# Patient Record
Sex: Female | Born: 1970 | Hispanic: Yes | Marital: Single | Smoking: Never smoker
Health system: Southern US, Community
[De-identification: ages and names within clinical notes are randomized; demographics above are authoritative.]

## PROBLEM LIST (undated history)

## (undated) DIAGNOSIS — I959 Hypotension, unspecified: Secondary | ICD-10-CM

## (undated) DIAGNOSIS — G44009 Cluster headache syndrome, unspecified, not intractable: Secondary | ICD-10-CM

## (undated) DIAGNOSIS — R7303 Prediabetes: Secondary | ICD-10-CM

## (undated) DIAGNOSIS — Z9989 Dependence on other enabling machines and devices: Secondary | ICD-10-CM

## (undated) DIAGNOSIS — D649 Anemia, unspecified: Secondary | ICD-10-CM

## (undated) DIAGNOSIS — M1990 Unspecified osteoarthritis, unspecified site: Secondary | ICD-10-CM

## (undated) DIAGNOSIS — J385 Laryngeal spasm: Secondary | ICD-10-CM

## (undated) DIAGNOSIS — T8859XA Other complications of anesthesia, initial encounter: Secondary | ICD-10-CM

## (undated) DIAGNOSIS — F329 Major depressive disorder, single episode, unspecified: Secondary | ICD-10-CM

## (undated) DIAGNOSIS — F419 Anxiety disorder, unspecified: Secondary | ICD-10-CM

## (undated) DIAGNOSIS — J45909 Unspecified asthma, uncomplicated: Secondary | ICD-10-CM

## (undated) DIAGNOSIS — I639 Cerebral infarction, unspecified: Secondary | ICD-10-CM

## (undated) DIAGNOSIS — G43909 Migraine, unspecified, not intractable, without status migrainosus: Secondary | ICD-10-CM

## (undated) DIAGNOSIS — K219 Gastro-esophageal reflux disease without esophagitis: Secondary | ICD-10-CM

## (undated) DIAGNOSIS — F32A Depression, unspecified: Secondary | ICD-10-CM

## (undated) DIAGNOSIS — T4145XA Adverse effect of unspecified anesthetic, initial encounter: Secondary | ICD-10-CM

## (undated) DIAGNOSIS — J189 Pneumonia, unspecified organism: Secondary | ICD-10-CM

## (undated) HISTORY — PX: MENISCUS REPAIR: SHX5179

## (undated) HISTORY — PX: KNEE ARTHROSCOPY: SUR90

## (undated) HISTORY — PX: BREAST REDUCTION SURGERY: SHX8

## (undated) HISTORY — PX: HYSTEROSCOPY: SHX211

---

## 2016-09-25 ENCOUNTER — Emergency Department (HOSPITAL_COMMUNITY): Payer: Medicaid - Out of State

## 2016-09-25 ENCOUNTER — Emergency Department (HOSPITAL_COMMUNITY)
Admission: EM | Admit: 2016-09-25 | Discharge: 2016-09-25 | Disposition: A | Discharge status: Home / Self Care | Payer: Medicaid - Out of State | Attending: Emergency Medicine | Admitting: Emergency Medicine

## 2016-09-25 ENCOUNTER — Encounter (HOSPITAL_COMMUNITY): Payer: Self-pay | Admitting: *Deleted

## 2016-09-25 DIAGNOSIS — Y93E8 Activity, other personal hygiene: Secondary | ICD-10-CM | POA: Insufficient documentation

## 2016-09-25 DIAGNOSIS — S83281A Other tear of lateral meniscus, current injury, right knee, initial encounter: Secondary | ICD-10-CM | POA: Insufficient documentation

## 2016-09-25 DIAGNOSIS — Y999 Unspecified external cause status: Secondary | ICD-10-CM | POA: Insufficient documentation

## 2016-09-25 DIAGNOSIS — Z79899 Other long term (current) drug therapy: Secondary | ICD-10-CM | POA: Insufficient documentation

## 2016-09-25 DIAGNOSIS — Y92009 Unspecified place in unspecified non-institutional (private) residence as the place of occurrence of the external cause: Secondary | ICD-10-CM | POA: Diagnosis not present

## 2016-09-25 DIAGNOSIS — S83421A Sprain of lateral collateral ligament of right knee, initial encounter: Secondary | ICD-10-CM | POA: Diagnosis not present

## 2016-09-25 DIAGNOSIS — S80911A Unspecified superficial injury of right knee, initial encounter: Secondary | ICD-10-CM | POA: Diagnosis present

## 2016-09-25 DIAGNOSIS — X509XXA Other and unspecified overexertion or strenuous movements or postures, initial encounter: Secondary | ICD-10-CM | POA: Diagnosis not present

## 2016-09-25 HISTORY — DX: Migraine, unspecified, not intractable, without status migrainosus: G43.909

## 2016-09-25 MED ORDER — ONDANSETRON 4 MG PO TBDP
ORAL_TABLET | ORAL | Status: AC
  Filled 2016-09-25: qty 1

## 2016-09-25 MED ORDER — HYDROCODONE-ACETAMINOPHEN 5-325 MG PO TABS: 1.0000 | ORAL_TABLET | ORAL | 0 refills | Status: DC | PRN

## 2016-09-25 MED ORDER — OXYCODONE-ACETAMINOPHEN 5-325 MG PO TABS
ORAL_TABLET | ORAL | Status: AC
  Filled 2016-09-25: qty 1

## 2016-09-25 MED ORDER — ONDANSETRON HCL 4 MG PO TABS: 4.0000 mg | ORAL_TABLET | Freq: Once | ORAL | Status: DC

## 2016-09-25 MED ORDER — OXYCODONE-ACETAMINOPHEN 5-325 MG PO TABS
1.0000 | ORAL_TABLET | ORAL | Status: DC | PRN
  Administered 2016-09-25: 1 via ORAL

## 2016-09-25 MED ORDER — METOCLOPRAMIDE HCL 10 MG PO TABS
10.0000 mg | ORAL_TABLET | Freq: Once | ORAL | Status: AC
  Administered 2016-09-25: 10 mg via ORAL
  Filled 2016-09-25: qty 1

## 2016-09-25 MED ORDER — TRAMADOL HCL 50 MG PO TABS: 50.0000 mg | ORAL_TABLET | Freq: Four times a day (QID) | ORAL | 0 refills | Status: DC | PRN

## 2016-09-25 MED ORDER — ONDANSETRON 4 MG PO TBDP
4.0000 mg | ORAL_TABLET | Freq: Once | ORAL | Status: AC
  Administered 2016-09-25: 4 mg via ORAL

## 2016-09-25 MED ORDER — METOCLOPRAMIDE HCL 10 MG PO TABS: 10.0000 mg | ORAL_TABLET | Freq: Four times a day (QID) | ORAL | 0 refills | Status: DC | PRN

## 2016-09-25 MED ORDER — NAPROXEN 375 MG PO TABS: 375.0000 mg | ORAL_TABLET | Freq: Two times a day (BID) | ORAL | 0 refills | Status: AC | PRN

## 2016-09-25 NOTE — ED Notes (Signed)
Patient given discharge instructions and verbalized understanding.  Patient stable to discharge at this time.  Patient is alert and oriented to baseline.  No distressed noted at this time.  All belongings taken with the patient at discharge.   

## 2016-09-25 NOTE — ED Notes (Signed)
Dr Isaacs at bedside 

## 2016-09-25 NOTE — ED Provider Notes (Signed)
[facility]ENCY DEPT Provider Note   CSN: [id number] Arrival date & time: 09/25/16  1645     History   Chief Complaint Chief Complaint  Patient presents with  . Knee Pain    HPI Rebecca Dougherty is a 46 y.o. female.  HPI 11 old female with history of multiple previous right knee injuries who presents with right-sided knee pain. The patient states that 3 weeks ago, she was trying to put a sock on when she felt a popping, tearing sensation in her right knee. She experienced acute onset of burning, throbbing pain of the knee. Since then, she has had progressively worsening pain with recurrent swelling with any weightbearing. She continues to feel clicking and popping with any movement of her knee. She has a history of meniscal injury and states this feels same. She is currently in process of moving from [place] and does not have an orthopedist here. Denies any fevers or chills. No open wounds.   Past Medical History:  Diagnosis Date  . Migraines     There are no active problems to display for this patient.   Past Surgical History:  Procedure Laterality Date  . MENISCUS REPAIR      OB History    No data available       Home Medications    Prior to Admission medications   Medication Sig Start Date End Date Taking? Authorizing Provider  buPROPion (WELLBUTRIN XL) 150 MG 24 hr tablet Take 150 mg by mouth 2 (two) times daily. 08/12/16  Yes [provider]  cetirizine (ZYRTEC) 10 MG tablet Take 10 mg by mouth at bedtime.   Yes [provider]  fexofenadine (ALLEGRA) 180 MG tablet Take 180 mg by mouth daily.   Yes [provider]  fluticasone (FLONASE) 50 MCG/ACT nasal spray Place 1 spray into both nostrils daily.   Yes [provider]  Glucosamine HCl (GLUCOSAMINE PO) Take 1 tablet by mouth daily.   Yes [provider]  meloxicam (MOBIC) 15 MG tablet Take 15 mg by mouth daily. 06/27/16  Yes [provider]  Multiple  Vitamins-Minerals (HAIR SKIN AND NAILS FORMULA PO) Take 1 tablet by mouth daily.   Yes [provider]  Multiple Vitamins-Minerals (MULTIVITAMIN WITH MINERALS) tablet Take 1 tablet by mouth daily.   Yes [provider]  TROKENDI XR 200 MG CP24 Take 200 mg by mouth daily. 08/12/16  Yes [provider]  HYDROcodone-acetaminophen (NORCO/VICODIN) 5-325 MG tablet Take 1-2 tablets by mouth every 4 (four) hours as needed for severe pain. 09/25/16   Shaune Pollack, MD  metoCLOPramide (REGLAN) 10 MG tablet Take 1 tablet (10 mg total) by mouth every 6 (six) hours as needed for nausea or vomiting. 09/25/16   Shaune Pollack, MD  naproxen (NAPROSYN) 375 MG tablet Take 1 tablet (375 mg total) by mouth 2 (two) times daily as needed for moderate pain. 09/25/16 10/02/16  Shaune Pollack, MD  traMADol (ULTRAM) 50 MG tablet Take 1-2 tablets (50-100 mg total) by mouth every 6 (six) hours as needed for severe pain. 09/25/16   Shaune Pollack, MD    Family History No family history on file.  Social History Social History  Substance Use Topics  . Smoking status: Never Smoker  . Smokeless tobacco: Never Used  . Alcohol use No     Allergies   Patient has no known allergies.   Review of Systems Review of Systems  Constitutional: Negative for chills and fever.  HENT: Negative for congestion, rhinorrhea  and sore throat.   Eyes: Negative for visual disturbance.  Respiratory: Negative for cough, shortness of breath and wheezing.   Cardiovascular: Negative for chest pain and leg swelling.  Gastrointestinal: Negative for abdominal pain, diarrhea, nausea and vomiting.  Genitourinary: Negative for dysuria, flank pain, vaginal bleeding and vaginal discharge.  Musculoskeletal: Positive for arthralgias and gait problem. Negative for neck pain.  Skin: Negative for rash.  Allergic/Immunologic: Negative for immunocompromised state.  Neurological: Positive for weakness. Negative for syncope and  headaches.  Hematological: Does not bruise/bleed easily.  All other systems reviewed and are negative.    Physical Exam Updated Vital Signs BP 101/72   Pulse 63   Temp 98.2 F (36.8 C) (Oral)   Resp 18   Ht 5\' 7"  (1.702 m)   Wt 81.6 kg (180 lb)   LMP 08/25/2016   SpO2 100%   BMI 28.19 kg/m   Physical Exam  Constitutional: She is oriented to person, place, and time. She appears well-developed and well-nourished. No distress.  HENT:  Head: Normocephalic and atraumatic.  Eyes: Conjunctivae are normal.  Neck: Neck supple.  Cardiovascular: Normal rate, regular rhythm and normal heart sounds.   Pulmonary/Chest: Effort normal. No respiratory distress. She has no wheezes.  Abdominal: She exhibits no distension.  Musculoskeletal: She exhibits no edema.  Neurological: She is alert and oriented to person, place, and time. She exhibits normal muscle tone.  Skin: Skin is warm. Capillary refill takes less than 2 seconds. No rash noted.  Nursing note and vitals reviewed.   LOWER EXTREMITY EXAM: RIGHT  INSPECTION & PALPATION: Marked TTP over posterior and lateral joint line, with palpable clicking on McMurray testing. No warmth, erythema, or significant effusion.  SENSORY: sensation is intact to light touch in:  Superficial peroneal nerve distribution (over dorsum of foot) Deep peroneal nerve distribution (over first dorsal web space) Sural nerve distribution (over lateral aspect 5th metatarsal) Saphenous nerve distribution (over medial instep)  MOTOR:  + Motor EHL (great toe dorsiflexion) + FHL (great toe plantar flexion)  + TA (ankle dorsiflexion)  + GSC (ankle plantar flexion)  VASCULAR: 2+ dorsalis pedis and posterior tibialis pulses Capillary refill < 2 sec, toes warm and well-perfused  COMPARTMENTS: Soft, warm, well-perfused No pain with passive extension No parethesias   ED Treatments / Results  Labs (all labs ordered are listed, but only abnormal results are  displayed) Labs Reviewed - No data to display  EKG  EKG Interpretation None       Radiology Dg Knee Complete 4 Views Right  Result Date: 09/25/2016 CLINICAL DATA:  Pain on lateral side of right knee. EXAM: RIGHT KNEE - COMPLETE 4+ VIEW COMPARISON:  None. FINDINGS: No evidence of fracture, dislocation, or joint effusion. There is decreased medial femoral tibial joint space and osteophyte formation. IMPRESSION: No acute fracture or dislocation. Degenerative joint changes of the right knee. Electronically Signed   By: Sherian ReinWei-Chen  Lin M.D.   On: 09/25/2016 20:05    Procedures Procedures (including critical care time)  Medications Ordered in ED Medications  oxyCODONE-acetaminophen (PERCOCET/ROXICET) 5-325 MG per tablet 1 tablet (1 tablet Oral Given 09/25/16 1718)  oxyCODONE-acetaminophen (PERCOCET/ROXICET) 5-325 MG per tablet (not administered)  ondansetron (ZOFRAN-ODT) 4 MG disintegrating tablet (not administered)  ondansetron (ZOFRAN-ODT) disintegrating tablet 4 mg (4 mg Oral Given 09/25/16 1718)  metoCLOPramide (REGLAN) tablet 10 mg (10 mg Oral Given 09/25/16 1933)     Initial Impression / Assessment and Plan / ED Course  I have reviewed the triage vital signs  and the nursing notes.  Pertinent labs & imaging results that were available during my care of the patient were reviewed by me and considered in my medical decision making (see chart for details).     46 yo F here with R knee pain, likely 2/2 recurrent meniscal injury. She has no significant joint effusion, warmth, or evidence to suggest septic arthritis. She is neurovascularly intact distal to the knee. Plain films show degenerative changes but no acute antibody. Will place the patient in a knee sleeve, given crutches, advised supportive care, and refer to orthopedics for further evaluation.  This note was prepared with assistance of Conservation officer, historic buildings. Occasional wrong-word or sound-a-like substitutions may have  occurred due to the inherent limitations of voice recognition software.   Final Clinical Impressions(s) / ED Diagnoses   Final diagnoses:  Sprain of lateral collateral ligament of right knee, initial encounter  Other tear of lateral meniscus of right knee as current injury, initial encounter    New Prescriptions Discharge Medication List as of 09/25/2016  8:33 PM    START taking these medications   Details  HYDROcodone-acetaminophen (NORCO/VICODIN) 5-325 MG tablet Take 1-2 tablets by mouth every 4 (four) hours as needed for severe pain., Starting Thu 09/25/2016, Print    metoCLOPramide (REGLAN) 10 MG tablet Take 1 tablet (10 mg total) by mouth every 6 (six) hours as needed for nausea or vomiting., Starting Thu 09/25/2016, Print    naproxen (NAPROSYN) 375 MG tablet Take 1 tablet (375 mg total) by mouth 2 (two) times daily as needed for moderate pain., Starting Thu 09/25/2016, Until Thu 10/02/2016, Print         Shaune Pollack, MD 09/25/16 2253

## 2016-09-25 NOTE — ED Triage Notes (Signed)
Pt states that she has torn her meniscus. Pt states that she has hx of same and is aware that she needs a partial knee repair. Pt reports trying to rest with no relief. Pt is from [place] but is moving here and will need an ortho referral

## 2016-09-25 NOTE — ED Notes (Signed)
Ortho paged for crutches 

## 2016-09-25 NOTE — Progress Notes (Signed)
Orthopedic Tech Progress Note Patient Details:  Rebecca Dougherty January 30, 1970 [id number]  Ortho Devices Type of Ortho Device: Crutches Ortho Device/Splint Location: Fitted and trained pt for crutch use.  pt stated she previously used crutches but was in too much pain to demostrate ambulation.   Ortho Device/Splint Interventions: Application, Adjustment   Alvina ChouWilliams, Arma Reining C 09/25/2016, 8:50 PM

## 2016-09-25 NOTE — ED Notes (Signed)
Pt transported to xray 

## 2016-09-29 ENCOUNTER — Ambulatory Visit (INDEPENDENT_AMBULATORY_CARE_PROVIDER_SITE_OTHER): Payer: PRIVATE HEALTH INSURANCE | Admitting: Family

## 2016-09-29 ENCOUNTER — Encounter (INDEPENDENT_AMBULATORY_CARE_PROVIDER_SITE_OTHER): Payer: Self-pay | Admitting: Family

## 2016-09-29 DIAGNOSIS — M25561 Pain in right knee: Secondary | ICD-10-CM | POA: Diagnosis not present

## 2016-09-29 DIAGNOSIS — M25361 Other instability, right knee: Secondary | ICD-10-CM

## 2016-09-29 DIAGNOSIS — Z87828 Personal history of other (healed) physical injury and trauma: Secondary | ICD-10-CM | POA: Diagnosis not present

## 2016-09-29 DIAGNOSIS — S8991XA Unspecified injury of right lower leg, initial encounter: Secondary | ICD-10-CM

## 2016-09-29 MED ORDER — TRAMADOL HCL 50 MG PO TABS: 50.0000 mg | ORAL_TABLET | Freq: Four times a day (QID) | ORAL | 0 refills | Status: DC | PRN

## 2016-09-29 MED ORDER — METHYLPREDNISOLONE ACETATE 40 MG/ML IJ SUSP
40.0000 mg | INTRAMUSCULAR | Status: AC | PRN
  Administered 2016-09-29: 40 mg via INTRA_ARTICULAR

## 2016-09-29 MED ORDER — LIDOCAINE HCL 1 % IJ SOLN
5.0000 mL | INTRAMUSCULAR | Status: AC | PRN
  Administered 2016-09-29: 5 mL

## 2016-09-29 NOTE — Progress Notes (Signed)
Office Visit Note   Patient: Rebecca Dougherty           Date of Birth: 12/08/1970           MRN: [id number] Visit Date: 09/29/2016              Requested by: No referring provider defined for this encounter. PCP: System, Pcp Not In  Chief Complaint  Patient presents with  . Right Knee - Pain      HPI: The patient is a 46 year old woman who presents today complaining of continued right knee pain. Had a right knee injury 3 weeks ago, was crossing her legs and felt a pop and heard a pop. Immediate onset of pain. She has been having difficulty with ambulation due to pain. She has instability of her knee giving way and popping and catching.  Has history of knee surgery on right knee x 2 and 2 meniscal tears on right. Feels she has torn her meniscus again.  Has a hinged knee brace from the ER. Feels it is too tight and not providing enough support. Does like wearing a neoprene sleeve. Using a cane today.   Assessment & Plan: Visit Diagnoses:  1. Knee injury, right, initial encounter   2. Knee instability, right   3. History of meniscal tear     Plan: depomedrol injection today. Offered new hinged brace, patient declined. Continue with ice.  follow up in office for MRI review.   Follow-Up Instructions: Return for mri review c duda.   Right Knee Exam   Tenderness  The patient is experiencing tenderness in the lateral joint line.  Range of Motion  The patient has normal right knee ROM. Right knee extension: pain with end extension.   Muscle Strength   The patient has normal right knee strength.  Tests  Drawer:       Anterior - positive    Posterior - negative  Valgus: negative  Other  Erythema: absent Swelling: moderate Other tests: no effusion present  Comments:  Mild varus laxity      Patient is alert, oriented, no adenopathy, well-dressed, normal affect, normal respiratory effort.   Imaging: No results found. No images are attached to the  encounter.  Labs: No results found for: HGBA1C, ESRSEDRATE, CRP, LABURIC, REPTSTATUS, GRAMSTAIN, CULT, LABORGA  Orders:  Orders Placed This Encounter  Procedures  . Large Joint Injection/Arthrocentesis  . MR Knee Right w/o contrast   Meds ordered this encounter  Medications  . traMADol (ULTRAM) 50 MG tablet    Sig: Take 1 tablet (50 mg total) by mouth every 6 (six) hours as needed.    Dispense:  30 tablet    Refill:  0     Procedures: Large Joint Inj Date/Time: 09/29/2016 2:56 PM Performed by: Barnie DelZAMORA, Karyn Brull R Authorized by: Barnie DelZAMORA, Orvie Caradine R   Consent Given by:  Patient Site marked: the procedure site was marked   Timeout: prior to procedure the correct patient, procedure, and site was verified   Indications:  Pain and diagnostic evaluation Location:  Knee Site:  R knee Needle Size:  22 G Needle Length:  1.5 inches Ultrasound Guidance: No   Fluoroscopic Guidance: No   Arthrogram: No   Medications:  5 mL lidocaine 1 %; 40 mg methylPREDNISolone acetate 40 MG/ML Aspiration Attempted: No   Patient tolerance:  Patient tolerated the procedure well with no immediate complications    Clinical Data: No additional findings.  ROS:  All other systems negative, except as noted  in the HPI. Review of Systems  Constitutional: Negative for chills and fever.  Musculoskeletal: Positive for arthralgias and joint swelling.    Objective: Vital Signs: There were no vitals taken for this visit.  Specialty Comments:  No specialty comments available.  PMFS History: There are no active problems to display for this patient.  Past Medical History:  Diagnosis Date  . Migraines     History reviewed. No pertinent family history.  Past Surgical History:  Procedure Laterality Date  . MENISCUS REPAIR     Social History   Occupational History  . Not on file.   Social History Main Topics  . Smoking status: Never Smoker  . Smokeless tobacco: Never Used  . Alcohol use No  . Drug  use: Unknown  . Sexual activity: Not on file

## 2016-10-07 ENCOUNTER — Ambulatory Visit
Admission: RE | Admit: 2016-10-07 | Discharge: 2016-10-07 | Disposition: A | Discharge status: Home / Self Care | Payer: PRIVATE HEALTH INSURANCE | Source: Ambulatory Visit | Attending: Family | Admitting: Family

## 2016-10-07 DIAGNOSIS — S8991XA Unspecified injury of right lower leg, initial encounter: Secondary | ICD-10-CM

## 2016-10-07 DIAGNOSIS — M25361 Other instability, right knee: Secondary | ICD-10-CM

## 2016-10-08 ENCOUNTER — Telehealth (INDEPENDENT_AMBULATORY_CARE_PROVIDER_SITE_OTHER): Payer: Self-pay | Admitting: *Deleted

## 2016-10-08 NOTE — Telephone Encounter (Signed)
Received fax from [facility] health thru Medicaid giving approval notice of CPT code 16109 with authorization number [id number]  Referred to [facility] medical center Prior Approval # [id number] Primary Dx code: M25.361 Primary Dx code desc. Other instabliity of right knee Date of approval: 10/02/2016 approvel valid 10/02/16-11/16/2016

## 2016-10-09 ENCOUNTER — Encounter (INDEPENDENT_AMBULATORY_CARE_PROVIDER_SITE_OTHER): Payer: Self-pay | Admitting: Orthopedic Surgery

## 2016-10-09 ENCOUNTER — Ambulatory Visit (INDEPENDENT_AMBULATORY_CARE_PROVIDER_SITE_OTHER): Payer: PRIVATE HEALTH INSURANCE | Admitting: Orthopedic Surgery

## 2016-10-09 DIAGNOSIS — S8991XA Unspecified injury of right lower leg, initial encounter: Secondary | ICD-10-CM

## 2016-10-09 DIAGNOSIS — M1711 Unilateral primary osteoarthritis, right knee: Secondary | ICD-10-CM | POA: Diagnosis not present

## 2016-10-09 NOTE — Progress Notes (Signed)
   Office Visit Note   Patient: Rebecca Dougherty           Date of Birth: 04-02-1970           MRN: [id number] Visit Date: 10/09/2016              Requested by: No referring provider defined for this encounter. PCP: System, Pcp Not In  Chief Complaint  Patient presents with  . Right Knee - Follow-up    Review MRI scan      HPI: Patient is a 46 year old woman with osteoarthritis of the right knee who had acute mechanical symptoms most likely acute meniscal tear or loose body. She is status post 3-arthroscopic interventions to the right knee 1 to the left knee. Patient complains of persistent pain with activities of daily living mechanical catching and locking of the right knee.  Assessment & Plan: Visit Diagnoses:  1. Unilateral primary osteoarthritis, right knee   2. Knee injury, right, initial encounter     Plan: Discussed with the patient that arthroscopy could potentially improve her symptoms about 50% that states this would not be a long-term treatment intervention. Also discussed possibly partial knee replacement however with her tricompartmental arthritic symptoms feel the patient would only benefit from a total knee arthroplasty. Patient wants to call her with the doctor in [place] she will delay with him the MRI findings and she will call us when she wants to schedule total knee replacement. Patient does not want to consider further arthroscopy.  Follow-Up Instructions: Return if symptoms worsen or fail to improve.   Ortho Exam  Patient is alert, oriented, no adenopathy, well-dressed, normal affect, normal respiratory effort. Examination patient has antalgic gait. There is mechanical crepitation with range of motion right knee she is tender to palpation patellofemoral joint as well as medial lateral joint line) presents are stable.  Imaging: No results found. No images are attached to the encounter.  Labs: No results found for: HGBA1C, ESRSEDRATE, CRP, LABURIC,  REPTSTATUS, GRAMSTAIN, CULT, LABORGA  Orders:  No orders of the defined types were placed in this encounter.  No orders of the defined types were placed in this encounter.    Procedures: No procedures performed  Clinical Data: No additional findings.  ROS:  All other systems negative, except as noted in the HPI. Review of Systems  Objective: Vital Signs: There were no vitals taken for this visit.  Specialty Comments:  No specialty comments available.  PMFS History: There are no active problems to display for this patient.  Past Medical History:  Diagnosis Date  . Migraines     History reviewed. No pertinent family history.  Past Surgical History:  Procedure Laterality Date  . MENISCUS REPAIR     Social History   Occupational History  . Not on file.   Social History Main Topics  . Smoking status: Never Smoker  . Smokeless tobacco: Never Used  . Alcohol use No  . Drug use: Unknown  . Sexual activity: Not on file

## 2016-10-13 ENCOUNTER — Telehealth (INDEPENDENT_AMBULATORY_CARE_PROVIDER_SITE_OTHER): Payer: Self-pay | Admitting: Orthopedic Surgery

## 2016-10-13 NOTE — Telephone Encounter (Signed)
Rebecca Dougherty called to advise that she is ready to schedule right knee arthroscopy for her meniscus tear.  She does not wish to proceed with TKA.  Can you please fill out a surgery sheet and I will call her to discuss possible dates.  Thank you.

## 2016-10-14 ENCOUNTER — Ambulatory Visit (INDEPENDENT_AMBULATORY_CARE_PROVIDER_SITE_OTHER): Payer: PRIVATE HEALTH INSURANCE | Admitting: Orthopedic Surgery

## 2016-10-17 ENCOUNTER — Telehealth (INDEPENDENT_AMBULATORY_CARE_PROVIDER_SITE_OTHER): Payer: Self-pay | Admitting: Orthopedic Surgery

## 2016-10-17 NOTE — Telephone Encounter (Signed)
Pt prior authorization   Dr. Lajoyce Corners is out of the network needs verification  for insurance purposes  Pt surgery is 10/22/2016   Care Department  [phone] 9am-5pm eastern standard time

## 2016-10-20 ENCOUNTER — Other Ambulatory Visit (INDEPENDENT_AMBULATORY_CARE_PROVIDER_SITE_OTHER): Payer: Self-pay | Admitting: Family

## 2016-10-20 ENCOUNTER — Telehealth (INDEPENDENT_AMBULATORY_CARE_PROVIDER_SITE_OTHER): Payer: Self-pay | Admitting: Orthopedic Surgery

## 2016-10-20 NOTE — Telephone Encounter (Signed)
Taren worked on this last week.  I believe she advised me that Ms. Urton was going to be self-pay for this procedure.

## 2016-10-20 NOTE — Telephone Encounter (Signed)
Fenda called from [facility] preservice center and said that the patients preauthorization for her surgery was denied. She gave me their phone number [phone] Reference # [id number]   You can get in touch with Fenda at [phone]

## 2016-10-21 ENCOUNTER — Encounter (HOSPITAL_COMMUNITY): Payer: Self-pay

## 2016-10-21 ENCOUNTER — Other Ambulatory Visit (HOSPITAL_COMMUNITY): Payer: Self-pay | Admitting: General Practice

## 2016-10-21 MED ORDER — CEFAZOLIN SODIUM-DEXTROSE 2-4 GM/100ML-% IV SOLN
2.0000 g | INTRAVENOUS | Status: AC
  Administered 2016-10-22: 2 g via INTRAVENOUS
  Filled 2016-10-21: qty 100

## 2016-10-21 NOTE — Telephone Encounter (Signed)
Advised Ins cov is out of state Medicaid. Taren spoke with pt on 10/17/16 and adv we don't accept and she would be considered self pay.

## 2016-10-21 NOTE — Progress Notes (Signed)
Spoke to pt and gave instructions for surgery in AM. Instructed to take wellbutrin, flonase, allegra and trokendi in AM with small sip of water.

## 2016-10-21 NOTE — Anesthesia Preprocedure Evaluation (Signed)
Anesthesia Evaluation  Patient identified by MRN, date of birth, ID band Patient awake    Reviewed: Allergy & Precautions, NPO status , Patient's Chart, lab work & pertinent test results  History of Anesthesia Complications (+) history of anesthetic complications  Airway Mallampati: II  TM Distance: >3 FB Neck ROM: Full    Dental no notable dental hx.    Pulmonary neg pulmonary ROS, asthma ,    Pulmonary exam normal breath sounds clear to auscultation       Cardiovascular negative cardio ROS Normal cardiovascular exam Rhythm:Regular Rate:Normal     Neuro/Psych  Headaches, negative neurological ROS  negative psych ROS   GI/Hepatic negative GI ROS, Neg liver ROS,   Endo/Other  negative endocrine ROS  Renal/GU negative Renal ROS  negative genitourinary   Musculoskeletal negative musculoskeletal ROS (+)   Abdominal   Peds negative pediatric ROS (+)  Hematology negative hematology ROS (+)   Anesthesia Other Findings   Reproductive/Obstetrics negative OB ROS                             Anesthesia Physical Anesthesia Plan  ASA: II  Anesthesia Plan: General   Post-op Pain Management:    Induction: Intravenous  PONV Risk Score and Plan: 3 and Ondansetron, Dexamethasone, Midazolam, Scopolamine patch - Pre-op and Treatment may vary due to age or medical condition  Airway Management Planned: LMA  Additional Equipment:   Intra-op Plan:   Post-operative Plan:   Informed Consent:   Plan Discussed with: CRNA and Surgeon  Anesthesia Plan Comments: ( )        Anesthesia Quick Evaluation

## 2016-10-22 ENCOUNTER — Ambulatory Visit (HOSPITAL_COMMUNITY)
Admission: RE | Admit: 2016-10-22 | Discharge: 2016-10-22 | Disposition: A | Discharge status: Home / Self Care | Payer: Medicaid - Out of State | Source: Ambulatory Visit | Attending: Orthopedic Surgery | Admitting: Orthopedic Surgery

## 2016-10-22 ENCOUNTER — Encounter (HOSPITAL_COMMUNITY)
Admission: RE | Disposition: A | Discharge status: Home / Self Care | Payer: Self-pay | Source: Ambulatory Visit | Attending: Orthopedic Surgery

## 2016-10-22 ENCOUNTER — Ambulatory Visit (HOSPITAL_COMMUNITY): Payer: Medicaid - Out of State | Admitting: Anesthesiology

## 2016-10-22 DIAGNOSIS — M21961 Unspecified acquired deformity of right lower leg: Secondary | ICD-10-CM | POA: Insufficient documentation

## 2016-10-22 DIAGNOSIS — M1711 Unilateral primary osteoarthritis, right knee: Secondary | ICD-10-CM | POA: Diagnosis not present

## 2016-10-22 DIAGNOSIS — Z87828 Personal history of other (healed) physical injury and trauma: Secondary | ICD-10-CM | POA: Diagnosis not present

## 2016-10-22 DIAGNOSIS — J45909 Unspecified asthma, uncomplicated: Secondary | ICD-10-CM | POA: Diagnosis not present

## 2016-10-22 DIAGNOSIS — F419 Anxiety disorder, unspecified: Secondary | ICD-10-CM | POA: Insufficient documentation

## 2016-10-22 DIAGNOSIS — Z8673 Personal history of transient ischemic attack (TIA), and cerebral infarction without residual deficits: Secondary | ICD-10-CM | POA: Diagnosis not present

## 2016-10-22 DIAGNOSIS — Z79899 Other long term (current) drug therapy: Secondary | ICD-10-CM | POA: Diagnosis not present

## 2016-10-22 DIAGNOSIS — F329 Major depressive disorder, single episode, unspecified: Secondary | ICD-10-CM | POA: Diagnosis not present

## 2016-10-22 DIAGNOSIS — X58XXXA Exposure to other specified factors, initial encounter: Secondary | ICD-10-CM | POA: Diagnosis not present

## 2016-10-22 DIAGNOSIS — M25861 Other specified joint disorders, right knee: Secondary | ICD-10-CM | POA: Diagnosis not present

## 2016-10-22 DIAGNOSIS — Y929 Unspecified place or not applicable: Secondary | ICD-10-CM | POA: Diagnosis not present

## 2016-10-22 DIAGNOSIS — S83241A Other tear of medial meniscus, current injury, right knee, initial encounter: Secondary | ICD-10-CM | POA: Diagnosis present

## 2016-10-22 HISTORY — PX: KNEE ARTHROSCOPY: SHX127

## 2016-10-22 HISTORY — DX: Adverse effect of unspecified anesthetic, initial encounter: T41.45XA

## 2016-10-22 HISTORY — DX: Hypotension, unspecified: I95.9

## 2016-10-22 HISTORY — DX: Major depressive disorder, single episode, unspecified: F32.9

## 2016-10-22 HISTORY — DX: Depression, unspecified: F32.A

## 2016-10-22 HISTORY — DX: Other complications of anesthesia, initial encounter: T88.59XA

## 2016-10-22 HISTORY — DX: Unspecified asthma, uncomplicated: J45.909

## 2016-10-22 HISTORY — DX: Cerebral infarction, unspecified: I63.9

## 2016-10-22 HISTORY — DX: Anxiety disorder, unspecified: F41.9

## 2016-10-22 LAB — HCG, SERUM, QUALITATIVE: PREG SERUM: NEGATIVE

## 2016-10-22 LAB — CBC
HCT: 40.4 % (ref 36.0–46.0)
Hemoglobin: 12.9 g/dL (ref 12.0–15.0)
MCH: 29.4 pg (ref 26.0–34.0)
MCHC: 31.9 g/dL (ref 30.0–36.0)
MCV: 92 fL (ref 78.0–100.0)
PLATELETS: 239 10*3/uL (ref 150–400)
RBC: 4.39 MIL/uL (ref 3.87–5.11)
RDW: 13 % (ref 11.5–15.5)
WBC: 9.1 10*3/uL (ref 4.0–10.5)

## 2016-10-22 SURGERY — ARTHROSCOPY, KNEE
Anesthesia: General | Site: Knee | Laterality: Right

## 2016-10-22 MED ORDER — PROPOFOL 10 MG/ML IV BOLUS
INTRAVENOUS | Status: DC | PRN
  Administered 2016-10-22: 50 mg via INTRAVENOUS
  Administered 2016-10-22: 150 mg via INTRAVENOUS

## 2016-10-22 MED ORDER — LIDOCAINE 2% (20 MG/ML) 5 ML SYRINGE
INTRAMUSCULAR | Status: AC
  Filled 2016-10-22: qty 5

## 2016-10-22 MED ORDER — MIDAZOLAM HCL 2 MG/2ML IJ SOLN
INTRAMUSCULAR | Status: AC
  Filled 2016-10-22: qty 2

## 2016-10-22 MED ORDER — MEPERIDINE HCL 25 MG/ML IJ SOLN: 6.2500 mg | INTRAMUSCULAR | Status: DC | PRN

## 2016-10-22 MED ORDER — PROPOFOL 10 MG/ML IV BOLUS
INTRAVENOUS | Status: AC
  Filled 2016-10-22: qty 20

## 2016-10-22 MED ORDER — OXYCODONE-ACETAMINOPHEN 5-325 MG PO TABS: 1.0000 | ORAL_TABLET | ORAL | 0 refills | Status: DC | PRN

## 2016-10-22 MED ORDER — ONDANSETRON HCL 4 MG PO TABS: 4.0000 mg | ORAL_TABLET | Freq: Three times a day (TID) | ORAL | 0 refills | Status: DC | PRN

## 2016-10-22 MED ORDER — FENTANYL CITRATE (PF) 100 MCG/2ML IJ SOLN
25.0000 ug | INTRAMUSCULAR | Status: DC | PRN
  Administered 2016-10-22 (×3): 50 ug via INTRAVENOUS

## 2016-10-22 MED ORDER — PHENYLEPHRINE HCL 10 MG/ML IJ SOLN
INTRAMUSCULAR | Status: DC | PRN
  Administered 2016-10-22 (×3): 80 ug via INTRAVENOUS

## 2016-10-22 MED ORDER — FENTANYL CITRATE (PF) 100 MCG/2ML IJ SOLN
INTRAMUSCULAR | Status: AC
  Filled 2016-10-22: qty 2

## 2016-10-22 MED ORDER — DEXAMETHASONE SODIUM PHOSPHATE 10 MG/ML IJ SOLN
INTRAMUSCULAR | Status: DC | PRN
  Administered 2016-10-22: 10 mg via INTRAVENOUS

## 2016-10-22 MED ORDER — ONDANSETRON HCL 4 MG/2ML IJ SOLN: 4.0000 mg | Freq: Once | INTRAMUSCULAR | Status: DC | PRN

## 2016-10-22 MED ORDER — LACTATED RINGERS IV SOLN
INTRAVENOUS | Status: DC
  Administered 2016-10-22: 08:00:00 via INTRAVENOUS

## 2016-10-22 MED ORDER — LACTATED RINGERS IV SOLN
INTRAVENOUS | Status: DC | PRN
  Administered 2016-10-22: 09:00:00 via INTRAVENOUS

## 2016-10-22 MED ORDER — LIDOCAINE HCL (CARDIAC) 20 MG/ML IV SOLN
INTRAVENOUS | Status: DC | PRN
  Administered 2016-10-22: 100 mg via INTRAVENOUS

## 2016-10-22 MED ORDER — ONDANSETRON HCL 4 MG/2ML IJ SOLN
INTRAMUSCULAR | Status: DC | PRN
  Administered 2016-10-22: 4 mg via INTRAVENOUS

## 2016-10-22 MED ORDER — MIDAZOLAM HCL 5 MG/5ML IJ SOLN
INTRAMUSCULAR | Status: DC | PRN
  Administered 2016-10-22: 2 mg via INTRAVENOUS

## 2016-10-22 MED ORDER — FENTANYL CITRATE (PF) 250 MCG/5ML IJ SOLN
INTRAMUSCULAR | Status: AC
  Filled 2016-10-22: qty 5

## 2016-10-22 MED ORDER — CHLORHEXIDINE GLUCONATE 4 % EX LIQD: 60.0000 mL | Freq: Once | CUTANEOUS | Status: DC

## 2016-10-22 MED ORDER — FENTANYL CITRATE (PF) 100 MCG/2ML IJ SOLN
INTRAMUSCULAR | Status: AC
  Administered 2016-10-22: 50 ug via INTRAVENOUS
  Filled 2016-10-22: qty 2

## 2016-10-22 MED ORDER — FENTANYL CITRATE (PF) 100 MCG/2ML IJ SOLN
INTRAMUSCULAR | Status: DC | PRN
  Administered 2016-10-22 (×3): 50 ug via INTRAVENOUS

## 2016-10-22 MED ORDER — SODIUM CHLORIDE 0.9 % IR SOLN
Status: DC | PRN
  Administered 2016-10-22: 6000 mL

## 2016-10-22 SURGICAL SUPPLY — 38 items
BLADE CUDA 5.5 (BLADE) IMPLANT
BLADE GREAT WHITE 4.2 (BLADE) ×2 IMPLANT
BLADE GREAT WHITE 4.2MM (BLADE) ×1
BNDG COHESIVE 6X5 TAN STRL LF (GAUZE/BANDAGES/DRESSINGS) ×3 IMPLANT
BNDG GAUZE ELAST 4 BULKY (GAUZE/BANDAGES/DRESSINGS) ×3 IMPLANT
BUR OVAL 6.0 (BURR) IMPLANT
COVER SURGICAL LIGHT HANDLE (MISCELLANEOUS) ×6 IMPLANT
CUFF TOURNIQUET SINGLE 34IN LL (TOURNIQUET CUFF) IMPLANT
CUFF TOURNIQUET SINGLE 44IN (TOURNIQUET CUFF) IMPLANT
DRAPE ARTHROSCOPY W/POUCH 114 (DRAPES) ×3 IMPLANT
DRAPE U-SHAPE 47X51 STRL (DRAPES) ×3 IMPLANT
DRSG ADAPTIC 3X8 NADH LF (GAUZE/BANDAGES/DRESSINGS) ×3 IMPLANT
DRSG EMULSION OIL 3X3 NADH (GAUZE/BANDAGES/DRESSINGS) ×3 IMPLANT
DRSG PAD ABDOMINAL 8X10 ST (GAUZE/BANDAGES/DRESSINGS) ×3 IMPLANT
DURAPREP 26ML APPLICATOR (WOUND CARE) ×3 IMPLANT
GAUZE SPONGE 4X4 12PLY STRL (GAUZE/BANDAGES/DRESSINGS) ×3 IMPLANT
GLOVE BIOGEL PI IND STRL 9 (GLOVE) ×1 IMPLANT
GLOVE BIOGEL PI INDICATOR 9 (GLOVE) ×2
GLOVE SURG ORTHO 9.0 STRL STRW (GLOVE) ×3 IMPLANT
GOWN STRL REUS W/ TWL XL LVL3 (GOWN DISPOSABLE) ×3 IMPLANT
GOWN STRL REUS W/TWL XL LVL3 (GOWN DISPOSABLE) ×6
KIT BASIN OR (CUSTOM PROCEDURE TRAY) ×3 IMPLANT
KIT ROOM TURNOVER OR (KITS) ×3 IMPLANT
MANIFOLD NEPTUNE II (INSTRUMENTS) ×3 IMPLANT
NEEDLE 18GX1X1/2 (RX/OR ONLY) (NEEDLE) ×3 IMPLANT
PACK ARTHROSCOPY DSU (CUSTOM PROCEDURE TRAY) ×3 IMPLANT
PAD ABD 8X10 STRL (GAUZE/BANDAGES/DRESSINGS) ×3 IMPLANT
PAD ARMBOARD 7.5X6 YLW CONV (MISCELLANEOUS) ×6 IMPLANT
PADDING CAST COTTON 6X4 STRL (CAST SUPPLIES) ×3 IMPLANT
SET ARTHROSCOPY TUBING (MISCELLANEOUS) ×2
SET ARTHROSCOPY TUBING LN (MISCELLANEOUS) ×1 IMPLANT
SUT ETHILON 4 0 PS 2 18 (SUTURE) ×3 IMPLANT
SYR 20CC LL (SYRINGE) ×3 IMPLANT
TOWEL OR 17X24 6PK STRL BLUE (TOWEL DISPOSABLE) ×6 IMPLANT
TUBE CONNECTING 12'X1/4 (SUCTIONS) ×1
TUBE CONNECTING 12X1/4 (SUCTIONS) ×2 IMPLANT
WAND HAND CNTRL MULTIVAC 90 (MISCELLANEOUS) IMPLANT
WATER STERILE IRR 1000ML POUR (IV SOLUTION) ×3 IMPLANT

## 2016-10-22 NOTE — Anesthesia Postprocedure Evaluation (Signed)
Anesthesia Post Note  Patient: Gredmarie Shatto  Procedure(s) Performed: Right Knee Arthroscopy and Debridement (Right Knee)     Patient location during evaluation: PACU Anesthesia Type: General Level of consciousness: awake and alert Pain management: pain level controlled Vital Signs Assessment: post-procedure vital signs reviewed and stable Respiratory status: spontaneous breathing, nonlabored ventilation, respiratory function stable and patient connected to nasal cannula oxygen Cardiovascular status: blood pressure returned to baseline and stable Postop Assessment: no apparent nausea or vomiting Anesthetic complications: no    Last Vitals:  Vitals:   10/22/16 1015 10/22/16 1022  BP:  116/83  Pulse: 73 64  Resp: 16 12  Temp:    SpO2: 98% 100%    Last Pain:  Vitals:   10/22/16 0950  TempSrc:   PainSc: 0-No pain                 Mckyle Solanki

## 2016-10-22 NOTE — Transfer of Care (Signed)
Immediate Anesthesia Transfer of Care Note  Patient: Rebecca Dougherty  Procedure(s) Performed: Right Knee Arthroscopy and Debridement (Right Knee)  Patient Location: PACU  Anesthesia Type:General  Level of Consciousness: awake  Airway & Oxygen Therapy: Patient Spontanous Breathing and Patient connected to nasal cannula oxygen  Post-op Assessment: Report given to RN and Post -op Vital signs reviewed and stable  Post vital signs: Reviewed and stable  Last Vitals:  Vitals:   10/22/16 0730 10/22/16 0950  BP: 114/69   Pulse:  (P) 63  Resp:  (P) 12  Temp:  (!) (P) 36.4 C  SpO2:  (P) 100%    Last Pain:  Vitals:   10/22/16 0727  TempSrc: Oral      Patients Stated Pain Goal: 3 (10/22/16 0738)  Complications: No apparent anesthesia complications

## 2016-10-22 NOTE — H&P (Signed)
Rebecca Dougherty is an 46 y.o. female.   Chief Complaint: Mechanical catching and locking right knee HPI: Patient is a 46 year old woman with osteoarthritis of the right knee who had acute mechanical symptoms most likely acute meniscal tear or loose body. She is status post 3-arthroscopic interventions to the right knee 1 to the left knee. Patient complains of persistent pain with activities of daily living mechanical catching and locking of the right knee.  Past Medical History:  Diagnosis Date  . Anxiety   . Asthma    allergy induced  . Complication of anesthesia    pt has hypotension during anesthesia  . Depression   . Hypotension   . Migraines    migraines  . Stroke [facility])    TIA's due to migraines    Past Surgical History:  Procedure Laterality Date  . BREAST REDUCTION SURGERY    . MENISCUS REPAIR      Family History  Problem Relation Age of Onset  . Alzheimer's disease Mother   . Anxiety disorder Mother    Social History:  reports that she has never smoked. She has never used smokeless tobacco. She reports that she does not drink alcohol. Her drug history is not on file.  Allergies:  Allergies  Allergen Reactions  . Apple Anaphylaxis  . Morphine And Related Other (See Comments)    HYPOTHERMIA    Medications Prior to Admission  Medication Sig Dispense Refill  . buPROPion (WELLBUTRIN XL) 150 MG 24 hr tablet Take 150 mg by mouth 2 (two) times daily.    . cetirizine (ZYRTEC) 10 MG tablet Take 10 mg by mouth at bedtime.    . fexofenadine (ALLEGRA) 180 MG tablet Take 180 mg by mouth daily.    . fluticasone (FLONASE) 50 MCG/ACT nasal spray Place 1 spray into both nostrils daily as needed for allergies.     . Glucosamine HCl (GLUCOSAMINE PO) Take 30 mLs by mouth daily.     . Homeopathic Products [facility] ALLERGY EYE RELIEF OP) Place 1 drop into both eyes as needed (allergies).    Marland Kitchen ketotifen (ZADITOR) 0.025 % ophthalmic solution Place 1 drop into both eyes daily.    .  meloxicam (MOBIC) 15 MG tablet Take 15 mg by mouth daily.    . Multiple Vitamins-Minerals (HAIR SKIN AND NAILS FORMULA PO) Take 1 tablet by mouth daily.    . Multiple Vitamins-Minerals (MULTIVITAMIN WITH MINERALS) tablet Take 1 tablet by mouth daily.    Marland Kitchen TROKENDI XR 200 MG CP24 Take 200 mg by mouth daily.    Marland Kitchen HYDROcodone-acetaminophen (NORCO/VICODIN) 5-325 MG tablet Take 1-2 tablets by mouth every 4 (four) hours as needed for severe pain. (Patient not taking: Reported on 10/20/2016) 15 tablet 0  . metoCLOPramide (REGLAN) 10 MG tablet Take 1 tablet (10 mg total) by mouth every 6 (six) hours as needed for nausea or vomiting. (Patient not taking: Reported on 10/20/2016) 20 tablet 0  . traMADol (ULTRAM) 50 MG tablet Take 1-2 tablets (50-100 mg total) by mouth every 6 (six) hours as needed for severe pain. (Patient not taking: Reported on 10/20/2016) 20 tablet 0  . traMADol (ULTRAM) 50 MG tablet Take 1 tablet (50 mg total) by mouth every 6 (six) hours as needed. (Patient not taking: Reported on 10/20/2016) 30 tablet 0    No results found for this or any previous visit (from the past 48 hour(s)). No results found.  Review of Systems  All other systems reviewed and are negative.   There were  no vitals taken for this visit. Physical Exam  On examination patient is alert oriented no adenopathy well-dressed normal affect normal respiratory effort she has an antalgic gait. Examination she has tenderness to palpation of the medial lateral joint line in the right knee close a crease H is stable. Assessment/Plan Assessment: Mechanical symptoms right knee possible meniscal tear or loose body.  Plan: We'll plan for arthroscopic intervention. Risks and benefits were discussed including persistent pain potential need for additional surgery. Patient states she understands wish to proceed at this time.  Nadara Mustard, MD 10/22/2016, 7:07 AM

## 2016-10-22 NOTE — Anesthesia Procedure Notes (Signed)
Procedure Name: LMA Insertion Date/Time: 10/22/2016 8:56 AM Performed by: Gavin Pound, Ski Polich J Pre-anesthesia Checklist: Patient identified, Emergency Drugs available, Suction available, Patient being monitored and Timeout performed Patient Re-evaluated:Patient Re-evaluated prior to induction Oxygen Delivery Method: Circle system utilized Preoxygenation: Pre-oxygenation with 100% oxygen Induction Type: IV induction Ventilation: Mask ventilation without difficulty LMA: LMA inserted LMA Size: 4.0 Number of attempts: 1 Placement Confirmation: positive ETCO2 and breath sounds checked- equal and bilateral Tube secured with: Tape Dental Injury: Teeth and Oropharynx as per pre-operative assessment

## 2016-10-22 NOTE — Addendum Note (Signed)
Addendum  created 10/22/16 1038 by Bethena Midget, MD   Sign clinical note

## 2016-10-22 NOTE — Op Note (Signed)
10/22/2016  9:44 AM  PATIENT:  Rebecca Dougherty    PRE-OPERATIVE DIAGNOSIS:  RIGHT KNEE MEDIAL MENISCUS TEAR  POST-OPERATIVE DIAGNOSIS:  Same  PROCEDURE:  Right Knee Arthroscopy and Debridement  SURGEON:  Nadara Mustard, MD  PHYSICIAN ASSISTANT:None ANESTHESIA:   General  PREOPERATIVE INDICATIONS:  Rebecca Dougherty is a  46 y.o. female with a diagnosis of RIGHT KNEE MEDIAL MENISCUS TEAR who failed conservative measures and elected for surgical management.    The risks benefits and alternatives were discussed with the patient preoperatively including but not limited to the risks of infection, bleeding, nerve injury, cardiopulmonary complications, the need for revision surgery, among others, and the patient was willing to proceed.  OPERATIVE IMPLANTS: None  OPERATIVE FINDINGS: Large osteochondral defect of the medial femoral condyle and degenerative tearing of the medial meniscus with fraying of the lateral meniscus.  OPERATIVE PROCEDURE: Patient brought the operating room and underwent a general anesthetic. After adequate levels anesthesia were obtained patient's right lower extremity was prepped using DuraPrep draped into a sterile field a timeout was called. The arthroscopy working portal was established anterior medially with the scope through the anterior lateral portal. Visualization showed a large osteochondral defect of the medial femoral condyle. Patient underwent abrasion chondroplasty back to bleeding viable subchondral bone of the medial femoral condyle. The defect was approximately 2 cm in diameter. Patient had a large degenerative tear of the medial meniscus. The shaver was used to debride the complex medial meniscal tear. Examination of the notch showed an intact anterior cruciate ligament. Examination the lateral joint line showed fraying of the lateral meniscus and patient underwent debridement of the frayed tears of the lateral meniscus. The articular cartilage of the lateral  femoral condyle and lateral tibial plateau were intact. Examination with the knee extended showed arthritic changes of the patellofemoral joint with osteochondral defects. Patient underwent debridement of the patellofemoral joint as well as excision of medial plica and further synovectomy with impingement anteriorly. The gutters were checked there were no loose bodies. The enhancements were removed the portals were closed using 3-0 nylon. Wound was covered with sterile dressing patient was extubated taken the PACU in stable condition.

## 2016-10-22 NOTE — Anesthesia Postprocedure Evaluation (Signed)
Anesthesia Post Note  Patient: Rebecca Dougherty  Procedure(s) Performed: Right Knee Arthroscopy and Debridement (Right Knee)     Patient location during evaluation: PACU Anesthesia Type: General Level of consciousness: awake and alert Pain management: pain level controlled Vital Signs Assessment: post-procedure vital signs reviewed and stable Respiratory status: spontaneous breathing, nonlabored ventilation, respiratory function stable and patient connected to nasal cannula oxygen Cardiovascular status: blood pressure returned to baseline and stable Postop Assessment: no apparent nausea or vomiting Anesthetic complications: no    Last Vitals:  Vitals:   10/22/16 1022 10/22/16 1030  BP: 116/83   Pulse: 64 63  Resp: 12 17  Temp:  (!) 36.3 C  SpO2: 100% 100%    Last Pain:  Vitals:   10/22/16 0950  TempSrc:   PainSc: 0-No pain                 Dazja Houchin

## 2016-10-23 ENCOUNTER — Encounter (HOSPITAL_COMMUNITY): Payer: Self-pay | Admitting: Orthopedic Surgery

## 2016-10-24 ENCOUNTER — Telehealth (INDEPENDENT_AMBULATORY_CARE_PROVIDER_SITE_OTHER): Payer: Self-pay | Admitting: Orthopedic Surgery

## 2016-10-24 NOTE — Telephone Encounter (Signed)
Patient called asked when can she remove her bandage. The number to contact patient is [phone]

## 2016-10-24 NOTE — Telephone Encounter (Signed)
I called and advised she can remove and apply dry dressing, or bandaid over portals.

## 2016-10-29 ENCOUNTER — Inpatient Hospital Stay (INDEPENDENT_AMBULATORY_CARE_PROVIDER_SITE_OTHER): Payer: PRIVATE HEALTH INSURANCE | Admitting: Orthopedic Surgery

## 2016-10-30 ENCOUNTER — Inpatient Hospital Stay (INDEPENDENT_AMBULATORY_CARE_PROVIDER_SITE_OTHER): Payer: PRIVATE HEALTH INSURANCE | Admitting: Orthopedic Surgery

## 2016-11-03 ENCOUNTER — Ambulatory Visit (INDEPENDENT_AMBULATORY_CARE_PROVIDER_SITE_OTHER): Payer: PRIVATE HEALTH INSURANCE | Admitting: Family

## 2016-11-03 DIAGNOSIS — M1711 Unilateral primary osteoarthritis, right knee: Secondary | ICD-10-CM

## 2016-11-03 DIAGNOSIS — Z87828 Personal history of other (healed) physical injury and trauma: Secondary | ICD-10-CM

## 2016-11-03 MED ORDER — ONDANSETRON HCL 4 MG PO TABS: 4.0000 mg | ORAL_TABLET | Freq: Three times a day (TID) | ORAL | 0 refills | Status: DC | PRN

## 2016-11-03 MED ORDER — OXYCODONE-ACETAMINOPHEN 5-325 MG PO TABS: 1.0000 | ORAL_TABLET | Freq: Four times a day (QID) | ORAL | 0 refills | Status: DC | PRN

## 2016-11-03 NOTE — Progress Notes (Signed)
   Post-Op Visit Note   Patient: Rebecca Dougherty           Date of Birth: 06/06/1970           MRN: [id number] Visit Date: 11/03/2016 PCP: System, Pcp Not In  Chief Complaint: No chief complaint on file.   HPI:  HPI The patient is a 46 year old woman just over a week out from right knee arthroscopy for a MM tear. Complains of continued pain. Worse with weight bearing. Intermittent swelling. Wearing a knee sleeve for comfort.   Ortho Exam Portals clean and dry. Sutures in place.  Visit Diagnoses:  1. Arthritis of knee, right   2. History of meniscal tear     Plan: sutures harvested. follow up in office in 2-3 weeks. May advance activities as tolerated. May use ice. Have refilled pain medication.  Follow-Up Instructions: Return in about 2 weeks (around 11/17/2016).   Imaging: No results found.  Orders:  No orders of the defined types were placed in this encounter.  No orders of the defined types were placed in this encounter.    PMFS History: Patient Active Problem List   Diagnosis Date Noted  . History of meniscal tear   . Arthritis of knee, right    Past Medical History:  Diagnosis Date  . Anxiety   . Asthma    allergy induced  . Complication of anesthesia    pt has hypotension during anesthesia  . Depression   . Hypotension   . Migraines    migraines  . Stroke [facility])    TIA's due to migraines    Family History  Problem Relation Age of Onset  . Alzheimer's disease Mother   . Anxiety disorder Mother     Past Surgical History:  Procedure Laterality Date  . BREAST REDUCTION SURGERY    . KNEE ARTHROSCOPY Right 10/22/2016   Procedure: Right Knee Arthroscopy and Debridement;  Surgeon: Nadara Mustard, MD;  Location: [facility] OR;  Service: Orthopedics;  Laterality: Right;  . MENISCUS REPAIR     Social History   Occupational History  . Not on file.   Social History Main Topics  . Smoking status: Never Smoker  . Smokeless tobacco: Never Used  . Alcohol use  No  . Drug use: Unknown  . Sexual activity: Not on file

## 2016-11-17 ENCOUNTER — Ambulatory Visit (INDEPENDENT_AMBULATORY_CARE_PROVIDER_SITE_OTHER): Payer: PRIVATE HEALTH INSURANCE | Admitting: Orthopedic Surgery

## 2016-11-19 ENCOUNTER — Ambulatory Visit (INDEPENDENT_AMBULATORY_CARE_PROVIDER_SITE_OTHER): Payer: PRIVATE HEALTH INSURANCE | Admitting: Orthopedic Surgery

## 2016-12-25 ENCOUNTER — Ambulatory Visit (INDEPENDENT_AMBULATORY_CARE_PROVIDER_SITE_OTHER): Payer: PRIVATE HEALTH INSURANCE | Admitting: Family

## 2016-12-25 ENCOUNTER — Encounter (INDEPENDENT_AMBULATORY_CARE_PROVIDER_SITE_OTHER): Payer: Self-pay | Admitting: Family

## 2016-12-25 DIAGNOSIS — Z87828 Personal history of other (healed) physical injury and trauma: Secondary | ICD-10-CM

## 2016-12-25 DIAGNOSIS — M1711 Unilateral primary osteoarthritis, right knee: Secondary | ICD-10-CM

## 2016-12-25 NOTE — Progress Notes (Addendum)
Office Visit Note   Patient: Rebecca Dougherty           Date of Birth: [birth year]           MRN: [id number] Visit Date: 12/25/2016              Requested by: No referring provider defined for this encounter. PCP: System, Pcp Not In  No chief complaint on file.     HPI: Patient is a [age] year old woman with osteoarthritis of the right knee who has had ongoing right knee pain with locking. Is status post recent arthroscopic debridement in early October of this year.   She is status post 3-arthroscopic interventions to the right knee 1 to the left knee. Patient complains of persistent pain with activities of daily living mechanical catching and locking of the right knee. States she gets around her home fine but when she must leave the home and walk on concrete has increased pain and swelling. States was dropped off at mall earlier this week and had excruciating pain while walking around the mall.   Has been trying to take her Percocet sparingly because this is the only thing that helps her pain.  Has tried Hydrocodone without relief. Does take Meloxicam daily. States she she doesn't take this for 2 days notices her knee swells more. Has not had relief with Naproxen or Celebrex. Has had Depomedrol injections in past without relief. Using BioFreeze which does help some. Has hinged knee brace but this does not "hold her bones in place well" however wearing a knee sleeve today which is helpful with her symptoms.   Requests hyaluronic acid injection, these have been helpful in past.   Assessment & Plan: Visit Diagnoses:  1. Arthritis of knee, right   2. History of meniscal tear     Plan: will proceed with PA for hyaluronic acid injection. Declined refill of Percocet.   Follow-Up Instructions: No Follow-up on file.   Ortho Exam  Patient is alert, oriented, no adenopathy, well-dressed, normal affect, normal respiratory effort. Examination patient has antalgic gait. There is mechanical  crepitation with range of motion right knee she is tender to palpation patellofemoral joint as well as medial lateral joint line. Collaterals and cruciates are stable.  Imaging: No results found. No images are attached to the encounter.  Labs: No results found for: HGBA1C, ESRSEDRATE, CRP, LABURIC, REPTSTATUS, GRAMSTAIN, CULT, LABORGA  Orders:  No orders of the defined types were placed in this encounter.  No orders of the defined types were placed in this encounter.    Procedures: No procedures performed  Clinical Data: No additional findings.  ROS:  All other systems negative, except as noted in the HPI. Review of Systems  Constitutional: Negative for chills and fever.  Musculoskeletal: Positive for arthralgias and joint swelling.    Objective: Vital Signs: There were no vitals taken for this visit.  Specialty Comments:  No specialty comments available.  PMFS History: Patient Active Problem List   Diagnosis Date Noted  . History of meniscal tear   . Arthritis of knee, right    Past Medical History:  Diagnosis Date  . Anxiety   . Asthma    allergy induced  . Complication of anesthesia    pt has hypotension during anesthesia  . Depression   . Hypotension   . Migraines    migraines  . Stroke [facility])    TIA's due to migraines    Family History  Problem Relation Age of Onset  .  Alzheimer's disease Mother   . Anxiety disorder Mother     Past Surgical History:  Procedure Laterality Date  . BREAST REDUCTION SURGERY    . KNEE ARTHROSCOPY Right 10/22/2016   Procedure: Right Knee Arthroscopy and Debridement;  Surgeon: Nadara Mustarduda, Marcus V, MD;  [facility];  Service: Orthopedics;  Laterality: Right;  . MENISCUS REPAIR     Social History   Occupational History  . Not on file  Tobacco Use  . Smoking status: Never Smoker  . Smokeless tobacco: Never Used  Substance and Sexual Activity  . Alcohol use: No  . Drug use: Not on file  . Sexual activity: Not on  file

## 2016-12-30 ENCOUNTER — Telehealth (INDEPENDENT_AMBULATORY_CARE_PROVIDER_SITE_OTHER): Payer: Self-pay

## 2016-12-30 NOTE — Telephone Encounter (Signed)
Prior auth sent through [employer] portal which is the preferred medication for this insurance.

## 2016-12-30 NOTE — Telephone Encounter (Signed)
-----   Message from Adonis HugueninErin R Zamora, NP sent at 12/25/2016  4:10 PM EST ----- Will you see if we can get hyaluronic acid injection right knee

## 2016-12-31 NOTE — Telephone Encounter (Signed)
[employer] one states that the pt's insurance has lapsed and that there has been no coverage since 11/20/16. Pt states that she missed the renewal and that she will have coverage reinstated after the first of the year. Asked that I hold her information and try again after first of the year.

## 2017-02-03 NOTE — Telephone Encounter (Signed)
auth resubmit to [employer] for benefits [name] will hold pending determination

## 2017-02-05 NOTE — Telephone Encounter (Signed)
Insurance pt has not obtained coverage.

## 2018-01-01 ENCOUNTER — Emergency Department (HOSPITAL_COMMUNITY)
Admission: EM | Admit: 2018-01-01 | Discharge: 2018-01-01 | Disposition: A | Discharge status: Home / Self Care | Payer: Medicaid - Out of State | Attending: Emergency Medicine | Admitting: Emergency Medicine

## 2018-01-01 ENCOUNTER — Emergency Department (HOSPITAL_COMMUNITY): Payer: Medicaid - Out of State

## 2018-01-01 DIAGNOSIS — Y929 Unspecified place or not applicable: Secondary | ICD-10-CM | POA: Diagnosis not present

## 2018-01-01 DIAGNOSIS — S82831A Other fracture of upper and lower end of right fibula, initial encounter for closed fracture: Secondary | ICD-10-CM | POA: Diagnosis present

## 2018-01-01 DIAGNOSIS — Y939 Activity, unspecified: Secondary | ICD-10-CM | POA: Diagnosis not present

## 2018-01-01 DIAGNOSIS — Y999 Unspecified external cause status: Secondary | ICD-10-CM | POA: Insufficient documentation

## 2018-01-01 DIAGNOSIS — W01198A Fall on same level from slipping, tripping and stumbling with subsequent striking against other object, initial encounter: Secondary | ICD-10-CM | POA: Insufficient documentation

## 2018-01-01 DIAGNOSIS — Z79899 Other long term (current) drug therapy: Secondary | ICD-10-CM | POA: Diagnosis not present

## 2018-01-01 DIAGNOSIS — M25371 Other instability, right ankle: Secondary | ICD-10-CM

## 2018-01-01 DIAGNOSIS — J45909 Unspecified asthma, uncomplicated: Secondary | ICD-10-CM | POA: Insufficient documentation

## 2018-01-01 MED ORDER — OXYCODONE-ACETAMINOPHEN 5-325 MG PO TABS: 1.0000 | ORAL_TABLET | Freq: Four times a day (QID) | ORAL | 0 refills | Status: DC | PRN

## 2018-01-01 MED ORDER — FENTANYL CITRATE (PF) 100 MCG/2ML IJ SOLN
50.0000 ug | Freq: Once | INTRAMUSCULAR | Status: AC
  Administered 2018-01-01: 50 ug via INTRAVENOUS
  Filled 2018-01-01: qty 2

## 2018-01-01 MED ORDER — ONDANSETRON 4 MG PO TBDP: 4.0000 mg | ORAL_TABLET | Freq: Once | ORAL | Status: DC

## 2018-01-01 MED ORDER — ONDANSETRON 4 MG PO TBDP: 4.0000 mg | ORAL_TABLET | Freq: Three times a day (TID) | ORAL | 0 refills | Status: DC | PRN

## 2018-01-01 MED ORDER — OXYCODONE-ACETAMINOPHEN 5-325 MG PO TABS: 1.0000 | ORAL_TABLET | Freq: Once | ORAL | Status: DC

## 2018-01-01 MED ORDER — OXYCODONE-ACETAMINOPHEN 5-325 MG PO TABS: 2.0000 | ORAL_TABLET | Freq: Four times a day (QID) | ORAL | 0 refills | Status: DC | PRN

## 2018-01-01 MED ORDER — ONDANSETRON 4 MG PO TBDP
4.0000 mg | ORAL_TABLET | Freq: Once | ORAL | Status: AC
  Administered 2018-01-01: 4 mg via ORAL
  Filled 2018-01-01: qty 1

## 2018-01-01 MED ORDER — OXYCODONE-ACETAMINOPHEN 5-325 MG PO TABS
1.0000 | ORAL_TABLET | Freq: Once | ORAL | Status: AC
  Administered 2018-01-01: 1 via ORAL
  Filled 2018-01-01: qty 1

## 2018-01-01 NOTE — Progress Notes (Signed)
Received call from ER provider.  I have reviewed the films, discussed the case, patient with acute ankle fracture, minimal displacement, recommended plaster splint, nonweightbearing, pain medications, return to clinic with me probably Monday morning, will likely need surgical intervention performed a elective basis.  Eulas PostJoshua P Reannah Totten, MD

## 2018-01-01 NOTE — ED Provider Notes (Signed)
[facility] EMERGENCY DEPARTMENT Provider Note   CSN: [id number] Arrival date & time: 01/01/18  1600     History   Chief Complaint Chief Complaint  Patient presents with  . Fall  . Ankle Pain  . Leg Pain    HPI Lynnie Honea is a 47 y.o. 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  She reports that she was outside helping a neighbor look for the dog when she slipped on the wet pavement and heard a cracking in her right ankle.  She did not strike her head or pass out.  Denies any other injuries or symptoms.  She has been unable to bear weight since she fell.  She denies any back or neck pain.  Does not take any blood thinning medications.  HPI  Past Medical History:  Diagnosis Date  . Anxiety   . Asthma    allergy induced  . Complication of anesthesia    pt has hypotension during anesthesia  . Depression   . Hypotension   . Migraines    migraines  . Stroke [facility])    TIA's due to migraines    Patient Active Problem List   Diagnosis Date Noted  . History of meniscal tear   . Arthritis of knee, right     Past Surgical History:  Procedure Laterality Date  . BREAST REDUCTION SURGERY    . KNEE ARTHROSCOPY Right 10/22/2016   Procedure: Right Knee Arthroscopy and Debridement;  Surgeon: Nadara Mustard, MD;  Location: [facility] OR;  Service: Orthopedics;  Laterality: Right;  . MENISCUS REPAIR       OB History   No obstetric history on file.      Home Medications    Prior to Admission medications   Medication Sig Start Date End Date Taking? Authorizing Provider  buPROPion (WELLBUTRIN XL) 150 MG 24 hr tablet Take 150 mg by mouth 2 (two) times daily. 08/12/16  Yes [provider]  cetirizine (ZYRTEC) 10 MG tablet Take 10 mg by mouth at bedtime as needed for allergies.    Yes [provider]  cholecalciferol (VITAMIN D3) 25 MCG (1000 UT) tablet Take 1,000 Units  by mouth daily.   Yes [provider]  fexofenadine (ALLEGRA) 180 MG tablet Take 180 mg by mouth daily as needed for allergies.    Yes [provider]  fluticasone (FLONASE) 50 MCG/ACT nasal spray Place 1 spray into both nostrils daily as needed for allergies.    Yes [provider]  ketotifen (ZADITOR) 0.025 % ophthalmic solution Place 1 drop into both eyes daily as needed (allergies).    Yes [provider]  meloxicam (MOBIC) 15 MG tablet Take 15 mg by mouth daily. 06/27/16  Yes [provider]  Multiple Vitamins-Minerals (HAIR SKIN AND NAILS FORMULA PO) Take 1 tablet by mouth daily.   Yes [provider]  Multiple Vitamins-Minerals (MULTIVITAMIN WITH MINERALS) tablet Take 1 tablet by mouth daily.   Yes [provider]  Probiotic Product (PROBIOTIC PO) Take 1 tablet by mouth daily.   Yes [provider]  TROKENDI XR 200 MG CP24 Take 200 mg by mouth daily. 08/12/16  Yes [provider]  ondansetron (ZOFRAN ODT) 4 MG disintegrating tablet Take 1 tablet (4 mg total) by mouth every 8 (eight) hours as needed for nausea or vomiting. 01/01/18   Cristina Gong, PA-C  oxyCODONE-acetaminophen (PERCOCET/ROXICET) 5-325 MG tablet Take 1  tablet by mouth every 6 (six) hours as needed for severe pain. 01/01/18   Cristina GongHammond, Elizabeth W, PA-C    Family History Family History  Problem Relation Age of Onset  . Alzheimer's disease Mother   . Anxiety disorder Mother     Social History Social History   Tobacco Use  . Smoking status: Never Smoker  . Smokeless tobacco: Never Used  Substance Use Topics  . Alcohol use: No  . Drug use: Not on file     Allergies   Apple and Morphine and related   Review of Systems Review of Systems  Constitutional: Negative for chills and fever.  Eyes: Negative for visual disturbance.  Cardiovascular: Negative for chest pain.  Gastrointestinal: Negative for abdominal pain.    Musculoskeletal: Negative for back pain, myalgias and neck pain.       Pain and swelling of right ankle.  Skin: Negative for color change and wound.  Neurological: Negative for dizziness, weakness, numbness and headaches.  All other systems reviewed and are negative.    Physical Exam Updated Vital Signs BP 117/75   Pulse 70   Temp 98.2 F (36.8 C) (Oral)   Resp 16   LMP 12/12/2017 (Approximate)   SpO2 96%   Physical Exam Vitals signs and nursing note reviewed.  Constitutional:      General: She is not in acute distress.    Appearance: She is well-developed. She is not diaphoretic.  HENT:     Head: Normocephalic and atraumatic.  Eyes:     General: No scleral icterus.       Right eye: No discharge.        Left eye: No discharge.     Conjunctiva/sclera: Conjunctivae normal.  Neck:     Musculoskeletal: Normal range of motion.     Comments: 2+ right DP pulse, unable to palpate PT pulse secondary to pain.  Right foot has brisk capillary refill, is warm well perfused. Cardiovascular:     Rate and Rhythm: Normal rate and regular rhythm.     Pulses: Normal pulses.  Pulmonary:     Effort: Pulmonary effort is normal. No respiratory distress.     Breath sounds: No stridor.  Abdominal:     General: There is no distension.  Musculoskeletal:        General: No deformity.     Comments: There is obvious swelling on the right ankle.  There is tenderness to palpation over bilateral malleoli.  No tenderness to palpation over the foot.  No deformities of the foot.  There is mild tenderness to palpation over proximal lower leg without crepitus or deformities.  Skin:    General: Skin is warm and dry.     Capillary Refill: Capillary refill takes less than 2 seconds.     Comments: No wounds present over right ankle.  Neurological:     Mental Status: She is alert.     Motor: No abnormal muscle tone.     Comments: Sensation intact to right foot.  Psychiatric:        Behavior: Behavior  normal.      ED Treatments / Results  Labs (all labs ordered are listed, but only abnormal results are displayed) Labs Reviewed - No data to display  EKG None  Radiology Dg Tibia/fibula Right  Result Date: 01/01/2018 CLINICAL DATA:  Initial evaluation for acute trauma, fall. EXAM: RIGHT TIBIA AND FIBULA - 2 VIEW COMPARISON:  Prior radiograph of the right ankle from earlier same day. FINDINGS: Previously  identified mildly displaced acute oblique fracture of the distal right fibula again seen, stable. No other acute fracture or dislocation about the tibia or fibula. Degenerative changes noted about the knee. Soft tissue swelling overlies the lateral malleolus. IMPRESSION: 1. Acute mildly displaced oblique fracture of the distal right fibula, stable from previous. 2. No other acute osseous abnormality about the right tibia/fibula. Electronically Signed   By: Rise Mu M.D.   On: 01/01/2018 18:26   Dg Ankle Complete Right  Result Date: 01/01/2018 CLINICAL DATA:  47 year old female with right ankle pain after slip and fall in the rain. EXAM: RIGHT ANKLE - COMPLETE 3+ VIEW COMPARISON:  None. FINDINGS: Oblique fracture through the distal right fibula metadiaphysis with mild medial angulation and distraction. Mild lateral subluxation of the mortise joint. Small ossific fragment at the tip of the medial malleolus appears acute with a small adjacent donor site. Mild anterior subluxation of the mortise joint. Talar dome intact. Calcaneus appears intact. Other visible bones of the right foot appear intact. IMPRESSION: 1. Oblique fracture through the distal right fibula metadiaphysis with mild medial angulation and distraction. 2. Small acute avulsion fracture at the tip of the medial malleolus. 3. Associated mild lateral and anterior subluxation of the mortise joint. Electronically Signed   By: Odessa Fleming M.D.   On: 01/01/2018 17:26    Procedures .Ortho Injury Treatment Date/Time: 01/01/2018  9:57 PM Performed by: Cristina Gong, PA-C Authorized by: Cristina Gong, PA-C   Consent:    Consent obtained:  Verbal   Consent given by:  Patient   Risks discussed:  Nerve damage, restricted joint movement, vascular damage, stiffness and fracture   Alternatives discussed:  No treatment, alternative treatment and referralInjury location: ankle Location details: right ankle Injury type: fracture Fracture type: lateral malleolus Pre-procedure neurovascular assessment: neurovascularly intact Pre-procedure distal perfusion: normal Pre-procedure neurological function: normal Pre-procedure range of motion: normal Manipulation performed: no Immobilization: splint Splint type: short leg Supplies used: plaster Post-procedure neurovascular assessment: post-procedure neurovascularly intact Patient tolerance: Patient tolerated the procedure well with no immediate complications    (including critical care time)  Medications Ordered in ED Medications  fentaNYL (SUBLIMAZE) injection 50 mcg (50 mcg Intravenous Given 01/01/18 1846)  ondansetron (ZOFRAN-ODT) disintegrating tablet 4 mg (4 mg Oral Given 01/01/18 1828)  oxyCODONE-acetaminophen (PERCOCET/ROXICET) 5-325 MG per tablet 1 tablet (1 tablet Oral Given 01/01/18 2012)  fentaNYL (SUBLIMAZE) injection 50 mcg (50 mcg Intravenous Given 01/01/18 2012)     Initial Impression / Assessment and Plan / ED Course  I have reviewed the triage vital signs and the nursing notes.  Pertinent labs & imaging results that were available during my care of the patient were reviewed by me and considered in my medical decision making (see chart for details).  Clinical Course as of Jan 02 2155  Fri Jan 01, 2018  716 47 year old female with trip and fall with twisting injury to her right ankle and heard a snap.  She is moderately swollen but has intact pulses.  By x-ray she has a distal fibular fracture and possibly a medial malleolar avulsion.   Orthopedics has been consulted and awaiting recommendations   [MB]  [phone] Spoke with Dr. Dion Saucier from orthopedics who requests a plaster splint with a posterior slab and stirrup nonweightbearing, crutches, and office follow-up next Monday.   [EH]  2046 Patient reevaluated.  [facility] had placed a Ortho-Glass fiberglass splint instead of the requested plaster splint.  Orthopedic tech was re-paged to re-splint patient.   [  EH]    Clinical Course User Index [EH] Cristina Gong, PA-C [MB] Terrilee Files, MD   Patient presents today for evaluation of sudden onset of right ankle pain after a mechanical fall earlier today.  On exam she has obvious swelling of her right ankle.  X-rays were obtained showing concern for an unstable ankle fracture with shifted mortise, lateral malleolus fracture and avulsion fracture on the tip of the medial malleolus.  Patient is neurovascularly intact.  I spoke with Dr. Dion Saucier from orthopedics who requested patient be placed in a plaster splint.  Patient's pain was treated in the emergency room with fentanyl, and Percocet.  PMP database was queried for the patient prior to the prescription of home narcotic pain medicine.  Splint was placed and after patient remained neurovascularly intact.  Patient given crutches, instructed to be nonweightbearing.  And follow-up instructions.  Return precautions were discussed with patient who states their understanding.  At the time of discharge patient denied any unaddressed complaints or concerns.  Patient is agreeable for discharge home.   Final Clinical Impressions(s) / ED Diagnoses   Final diagnoses:  Closed fracture of distal end of right fibula, unspecified fracture morphology, initial encounter  Unstable ankle, right    ED Discharge Orders         Ordered    oxyCODONE-acetaminophen (PERCOCET/ROXICET) 5-325 MG tablet  Every 6 hours PRN,   Status:  Discontinued     01/01/18 2126    ondansetron (ZOFRAN ODT) 4 MG  disintegrating tablet  Every 8 hours PRN,   Status:  Discontinued     01/01/18 2126    ondansetron (ZOFRAN ODT) 4 MG disintegrating tablet  Every 8 hours PRN     01/01/18 2129    oxyCODONE-acetaminophen (PERCOCET/ROXICET) 5-325 MG tablet  Every 6 hours PRN     01/01/18 2129           Cristina Gong, PA-C 01/01/18 2200    Terrilee Files, MD 01/02/18 [phone]

## 2018-01-01 NOTE — Discharge Instructions (Signed)
Please do not put any weight on your broken leg.  Please keep your leg elevated above your heart as much as possible.  This will help significantly reduce the amount of swelling and pain that you have.  Please take Ibuprofen (Advil, motrin) and Tylenol (acetaminophen) to relieve your pain.  You may take up to 600 MG (3 pills) of normal strength ibuprofen every 8 hours as needed.  In between doses of ibuprofen you make take tylenol, up to 1,000 mg (two extra strength pills).  Do not take more than 3,000 mg tylenol in a 24 hour period.  Please check all medication labels as many medications such as pain and cold medications may contain tylenol.  Do not drink alcohol while taking these medications.  Do not take other NSAID'S while taking ibuprofen (such as aleve or naproxen).  Please take ibuprofen with food to decrease stomach upset.  Today you received medications that may make you sleepy or impair your ability to make decisions.  For the next 24 hours please do not drive, operate heavy machinery, care for a small child with out another adult present, or perform any activities that may cause harm to you or someone else if you were to fall asleep or be impaired.   You are being prescribed a medication which may make you sleepy. Please follow up of listed precautions for at least 24 hours after taking one dose.

## 2018-01-01 NOTE — ED Triage Notes (Signed)
Pt. Rebecca SeatFell a few minutes ago outside.  She is having rt. Ankle pain radiates into her rt. Upper leg.  She is unable to put weight on the leg.  Shoe removed and rt. Ankle is swollen,  +CNS.  No deformity noted.  Pt. Is alert and oriented X4

## 2018-01-05 ENCOUNTER — Emergency Department (HOSPITAL_COMMUNITY)
Admission: EM | Admit: 2018-01-05 | Discharge: 2018-01-05 | Disposition: A | Discharge status: Home / Self Care | Payer: Medicaid - Out of State | Attending: Emergency Medicine | Admitting: Emergency Medicine

## 2018-01-05 ENCOUNTER — Other Ambulatory Visit: Payer: Self-pay

## 2018-01-05 ENCOUNTER — Emergency Department (HOSPITAL_COMMUNITY): Payer: Medicaid - Out of State

## 2018-01-05 DIAGNOSIS — F419 Anxiety disorder, unspecified: Secondary | ICD-10-CM | POA: Diagnosis not present

## 2018-01-05 DIAGNOSIS — M79671 Pain in right foot: Secondary | ICD-10-CM | POA: Insufficient documentation

## 2018-01-05 DIAGNOSIS — Z8673 Personal history of transient ischemic attack (TIA), and cerebral infarction without residual deficits: Secondary | ICD-10-CM | POA: Diagnosis not present

## 2018-01-05 DIAGNOSIS — J45909 Unspecified asthma, uncomplicated: Secondary | ICD-10-CM | POA: Diagnosis not present

## 2018-01-05 DIAGNOSIS — Z79899 Other long term (current) drug therapy: Secondary | ICD-10-CM | POA: Diagnosis not present

## 2018-01-05 DIAGNOSIS — F329 Major depressive disorder, single episode, unspecified: Secondary | ICD-10-CM | POA: Insufficient documentation

## 2018-01-05 MED ORDER — KETOROLAC TROMETHAMINE 30 MG/ML IJ SOLN
15.0000 mg | Freq: Once | INTRAMUSCULAR | Status: AC
  Administered 2018-01-05: 15 mg via INTRAMUSCULAR
  Filled 2018-01-05: qty 1

## 2018-01-05 NOTE — ED Provider Notes (Signed)
[facility] EMERGENCY DEPARTMENT Provider Note   CSN: [id number] Arrival date & time: 01/05/18  1314     History   Chief Complaint No chief complaint on file.   HPI Rebecca Dougherty is a 47 y.o. female.  HPI Patient presents 3 days after initial evaluation following a fall, now with concern for diminished sensation in her right leg, increasing pain. Patient was diagnosed with ankle fracture, discharged with plaster splint in place. She notes that today, she was scheduled to follow-up with orthopedics, but due to increasing severe pain, not improved with meloxicam, as well as loss of sensation distally, she felt need for emergency evaluation. She denies other complaints, including chest pain, dyspnea. Previously meloxicam had controlled her pain. She notes that over the past day in particular, the pain is been increasing, not controlled, is sore, severe, sharp, with associated distal loss of sensation. There is no discoloration in the distal toes as well. Past Medical History:  Diagnosis Date  . Anxiety   . Asthma    allergy induced  . Complication of anesthesia    pt has hypotension during anesthesia  . Depression   . Hypotension   . Migraines    migraines  . Stroke [facility])    TIA's due to migraines    Patient Active Problem List   Diagnosis Date Noted  . History of meniscal tear   . Arthritis of knee, right     Past Surgical History:  Procedure Laterality Date  . BREAST REDUCTION SURGERY    . KNEE ARTHROSCOPY Right 10/22/2016   Procedure: Right Knee Arthroscopy and Debridement;  Surgeon: Nadara Mustarduda, Marcus V, MD;  [facility];  Service: Orthopedics;  Laterality: Right;  . MENISCUS REPAIR       OB History   No obstetric history on file.      Home Medications    Prior to Admission medications   Medication Sig Start Date End Date Taking? Authorizing Provider  acetaminophen (TYLENOL) 500 MG tablet Take 1,000 mg by mouth 2 (two) times daily as  needed (for pain).   Yes [provider]  albuterol (VENTOLIN HFA) 108 (90 Base) MCG/ACT inhaler Inhale 2 puffs into the lungs every 6 (six) hours as needed for wheezing or shortness of breath (or seasonal allergies).   Yes [provider]  aspirin 81 MG chewable tablet Chew 81 mg by mouth once as needed (for ischemic attacks).   Yes [provider]  buPROPion (WELLBUTRIN XL) 150 MG 24 hr tablet Take 150 mg by mouth 2 (two) times daily. 08/12/16  Yes [provider]  cetirizine (ZYRTEC) 10 MG tablet Take 10 mg by mouth at bedtime as needed for allergies.    Yes [provider]  cholecalciferol (VITAMIN D3) 25 MCG (1000 UT) tablet Take 1,000 Units by mouth daily.   Yes [provider]  fexofenadine (ALLEGRA) 180 MG tablet Take 180 mg by mouth daily.    Yes [provider]  fluticasone (FLONASE) 50 MCG/ACT nasal spray Place 1 spray into both nostrils daily as needed for allergies.    Yes [provider]  ketotifen (ZADITOR) 0.025 % ophthalmic solution Place 1 drop into both eyes daily as needed (allergies).    Yes [provider]  meloxicam (MOBIC) 15 MG tablet Take 15 mg by mouth daily. 06/27/16  Yes [provider]  Misc Natural Products (TART CHERRY ADVANCED PO) Take 15 mLs by mouth daily.   Yes [provider]  NON FORMULARY  Place 30 mg under the tongue See admin instructions. CBD oil: Place 30 mg of oil under the tongue 1-2 times a day as needed for pain   Yes [provider]  NON FORMULARY Take 1 tablet by mouth See admin instructions. Garden of Life Multivitamin for Women: Take 1 tablet by mouth once a day   Yes [provider]  ondansetron (ZOFRAN ODT) 4 MG disintegrating tablet Take 1 tablet (4 mg total) by mouth every 8 (eight) hours as needed for nausea or vomiting. 01/01/18  Yes Cristina Gong, PA-C  Probiotic Product (PROBIOTIC PO) Take 1 tablet by mouth daily.   Yes  [provider]  TROKENDI XR 200 MG CP24 Take 200 mg by mouth daily. 08/12/16  Yes [provider]  oxyCODONE-acetaminophen (PERCOCET/ROXICET) 5-325 MG tablet Take 1 tablet by mouth every 6 (six) hours as needed for severe pain. Patient not taking: Reported on 01/05/2018 01/01/18   Cristina Gong, PA-C    Family History Family History  Problem Relation Age of Onset  . Alzheimer's disease Mother   . Anxiety disorder Mother     Social History Social History   Tobacco Use  . Smoking status: Never Smoker  . Smokeless tobacco: Never Used  Substance Use Topics  . Alcohol use: No  . Drug use: Not on file     Allergies   Apple; Percocet [oxycodone-acetaminophen]; and Morphine and related   Review of Systems Review of Systems  Constitutional:       Per HPI, otherwise negative  HENT:       Per HPI, otherwise negative  Respiratory:       Per HPI, otherwise negative  Cardiovascular:       Per HPI, otherwise negative  Gastrointestinal: Negative for vomiting.  Endocrine:       Negative aside from HPI  Genitourinary:       Neg aside from HPI   Musculoskeletal:       Per HPI, otherwise negative  Skin: Positive for color change.  Neurological: Negative for syncope.     Physical Exam Updated Vital Signs BP 129/89 (BP Location: Right Arm)   Pulse 75   Temp (!) 97.3 F (36.3 C) (Oral)   Resp 16   LMP 12/12/2017 (Approximate)   SpO2 100%   Physical Exam Vitals signs and nursing note reviewed.  Constitutional:      General: She is not in acute distress.    Appearance: She is well-developed.  HENT:     Head: Normocephalic and atraumatic.  Eyes:     Conjunctiva/sclera: Conjunctivae normal.  Cardiovascular:     Rate and Rhythm: Normal rate and regular rhythm.  Pulmonary:     Effort: Pulmonary effort is normal. No respiratory distress.     Breath sounds: Normal breath sounds. No stridor.  Abdominal:     General: There is no distension.    Musculoskeletal:     Right knee: Normal.       Legs:       Feet:  Skin:    General: Skin is warm and dry.  Neurological:     Mental Status: She is alert and oriented to person, place, and time.     Cranial Nerves: No cranial nerve deficit.      ED Treatments / Results    Radiology Dg Ankle 2 Views Right  Result Date: 01/05/2018 CLINICAL DATA:  Ankle pain and swelling. EXAM: RIGHT ANKLE - 2 VIEW COMPARISON:  01/01/2018. FINDINGS: Patient is in  a plaster cast. Angulated fracture of the distal fibula is noted. No acute bony abnormality identified. IMPRESSION: Angulated fracture of the distal fibula is noted. Electronically Signed   By: Maisie Fus  Register   On: 01/05/2018 14:30    Procedures Procedures (including critical care time)  Medications Ordered in ED Medications  ketorolac (TORADOL) 30 MG/ML injection 15 mg (15 mg Intramuscular Given 01/05/18 1402)     Initial Impression / Assessment and Plan / ED Course  I have reviewed the triage vital signs and the nursing notes.  Pertinent labs & imaging results that were available during my care of the patient were reviewed by me and considered in my medical decision making (see chart for details).     3:53 PM Patient has had removal and replacement of her right ankle splint This was performed by the orthopedic technicians after spoke with him, discussed her case with our orthopedic colleagues. Now, on repeat exam she states that she feels better, sensation has returned in her foot, pain has diminished. No evidence for DVT, PE, or other acute new pathology. Patient will follow closely with orthopedics.  Final Clinical Impressions(s) / ED Diagnoses   Final diagnoses:  Foot pain, right      Gerhard Munch, MD 01/05/18 [phone]

## 2018-01-05 NOTE — ED Notes (Signed)
Patient transported to X-ray 

## 2018-01-05 NOTE — Consult Note (Addendum)
Reason for Consult:Right ankle pain Referring Physician: R Jeraldine LootsLockwood  Rebecca Dougherty is an 47 y.o. female.  HPI: Rebecca Dougherty fell on a wet hill on Friday and broke her ankle. She came to the ED and was placed in a splint. She was to f/u with Dr. Dion SaucierLandau but there have been insurance issues. Today she had increased pain and sensory changes and returned to the ED for evaluation. Prior to my seeing her her splint was valved and she is feeling much better.  Past Medical History:  Diagnosis Date  . Anxiety   . Asthma    allergy induced  . Complication of anesthesia    pt has hypotension during anesthesia  . Depression   . Hypotension   . Migraines    migraines  . Stroke [facility](HCC)    TIA's due to migraines    Past Surgical History:  Procedure Laterality Date  . BREAST REDUCTION SURGERY    . KNEE ARTHROSCOPY Right 10/22/2016   Procedure: Right Knee Arthroscopy and Debridement;  Surgeon: Nadara Mustarduda, Marcus V, MD;  [facility];  Service: Orthopedics;  Laterality: Right;  . MENISCUS REPAIR      Family History  Problem Relation Age of Onset  . Alzheimer's disease Mother   . Anxiety disorder Mother     Social History:  reports that she has never smoked. She has never used smokeless tobacco. She reports that she does not drink alcohol. No history on file for drug.  Allergies:  Allergies  Allergen Reactions  . Apple Anaphylaxis  . Morphine And Related Other (See Comments)    HYPOTHERMIA    Medications: I have reviewed the patient's current medications.  No results found for this or any previous visit (from the past 48 hour(s)).  Dg Ankle 2 Views Right  Result Date: 01/05/2018 CLINICAL DATA:  Ankle pain and swelling. EXAM: RIGHT ANKLE - 2 VIEW COMPARISON:  01/01/2018. FINDINGS: Patient is in a plaster cast. Angulated fracture of the distal fibula is noted. No acute bony abnormality identified. IMPRESSION: Angulated fracture of the distal fibula is noted. Electronically Signed   By: Maisie Fushomas   Register   On: 01/05/2018 14:30    Review of Systems  Constitutional: Negative for weight loss.  HENT: Negative for ear discharge, ear pain, hearing loss and tinnitus.   Eyes: Negative for blurred vision, double vision, photophobia and pain.  Respiratory: Negative for cough, sputum production and shortness of breath.   Cardiovascular: Negative for chest pain.  Gastrointestinal: Negative for abdominal pain, nausea and vomiting.  Genitourinary: Negative for dysuria, flank pain, frequency and urgency.  Musculoskeletal: Positive for joint pain (Right ankle). Negative for back pain, falls, myalgias and neck pain.  Neurological: Negative for dizziness, tingling, sensory change, focal weakness, loss of consciousness and headaches.  Endo/Heme/Allergies: Does not bruise/bleed easily.  Psychiatric/Behavioral: Negative for depression, memory loss and substance abuse. The patient is not nervous/anxious.    Blood pressure 129/89, pulse 75, temperature (!) 97.3 F (36.3 C), temperature source Oral, resp. rate 16, last menstrual period 12/12/2017, SpO2 100 %. Physical Exam  Constitutional: She appears well-developed and well-nourished. No distress.  HENT:  Head: Normocephalic and atraumatic.  Eyes: Conjunctivae are normal. Right eye exhibits no discharge. Left eye exhibits no discharge. No scleral icterus.  Neck: Normal range of motion.  Cardiovascular: Normal rate and regular rhythm.  Respiratory: Effort normal. No respiratory distress.  Musculoskeletal:     Comments: RLE No traumatic wounds or rash, ecchymosis noted proximal to 2,3 MTP joints  Short  leg splint in place  No knee effusion  Knee stable to varus/ valgus and anterior/posterior stress  Sens DPN, SPN, TN intact  Motor EHL 5/5  No significant edema  Neurological: She is alert.  Skin: Skin is warm and dry. She is not diaphoretic.  Psychiatric: She has a normal mood and affect. Her behavior is normal.    Assessment/Plan: Right  distal fibula fx -- Hopefully can get insurance issues resolved so she can see Dr. Dion Saucier next week. Keep foot elevated. Ice 30 mintues 4x/day through Friday. NWB.    Freeman Caldron, PA-C Orthopedic Surgery [phone] 01/05/2018, 3:23 PM   Reviewed and agree with above. I have communicated to my office to reach out and get her in to see me asap.  Eulas Post, MD

## 2018-01-05 NOTE — ED Triage Notes (Signed)
Pt presents to ED after breaking her ankle with cast placement on Friday. Was supposed to follow up with ortho but d/t insurance problems she was having issues following up. Pt says her toes on casted foot are discolored and foot is cold.

## 2018-01-05 NOTE — Discharge Instructions (Addendum)
As discussed, your evaluation today has been largely reassuring.  But, it is important that you monitor your condition carefully, and do not hesitate to return to the ED if you develop new, or concerning changes in your condition. ? ?Otherwise, please follow-up with your physician for appropriate ongoing care. ? ?

## 2018-01-07 ENCOUNTER — Encounter (HOSPITAL_COMMUNITY): Payer: Self-pay | Admitting: *Deleted

## 2018-01-07 ENCOUNTER — Ambulatory Visit (INDEPENDENT_AMBULATORY_CARE_PROVIDER_SITE_OTHER): Payer: PRIVATE HEALTH INSURANCE

## 2018-01-07 ENCOUNTER — Other Ambulatory Visit (INDEPENDENT_AMBULATORY_CARE_PROVIDER_SITE_OTHER): Payer: Self-pay | Admitting: Orthopedic Surgery

## 2018-01-07 ENCOUNTER — Encounter (INDEPENDENT_AMBULATORY_CARE_PROVIDER_SITE_OTHER): Payer: Self-pay | Admitting: Orthopedic Surgery

## 2018-01-07 ENCOUNTER — Ambulatory Visit (INDEPENDENT_AMBULATORY_CARE_PROVIDER_SITE_OTHER): Payer: PRIVATE HEALTH INSURANCE | Admitting: Orthopedic Surgery

## 2018-01-07 ENCOUNTER — Other Ambulatory Visit: Payer: Self-pay

## 2018-01-07 VITALS — Ht 67.0 in | Wt 190.0 lb

## 2018-01-07 DIAGNOSIS — M25512 Pain in left shoulder: Secondary | ICD-10-CM | POA: Diagnosis not present

## 2018-01-07 DIAGNOSIS — S82891A Other fracture of right lower leg, initial encounter for closed fracture: Secondary | ICD-10-CM

## 2018-01-07 DIAGNOSIS — S82891D Other fracture of right lower leg, subsequent encounter for closed fracture with routine healing: Secondary | ICD-10-CM

## 2018-01-07 MED ORDER — HYDROCODONE-ACETAMINOPHEN 5-325 MG PO TABS: 1.0000 | ORAL_TABLET | Freq: Four times a day (QID) | ORAL | 0 refills | Status: DC | PRN

## 2018-01-07 NOTE — Progress Notes (Signed)
Spoke with pt for pre-op call. Pt denies cardiac history, HTN or Diabetes. 

## 2018-01-08 ENCOUNTER — Ambulatory Visit (HOSPITAL_COMMUNITY)
Admission: RE | Admit: 2018-01-08 | Discharge: 2018-01-08 | Disposition: A | Discharge status: Home / Self Care | Payer: Medicaid - Out of State | Attending: Orthopedic Surgery | Admitting: Orthopedic Surgery

## 2018-01-08 ENCOUNTER — Encounter (HOSPITAL_COMMUNITY): Payer: Self-pay

## 2018-01-08 ENCOUNTER — Encounter (HOSPITAL_COMMUNITY)
Admission: RE | Disposition: A | Discharge status: Home / Self Care | Payer: Self-pay | Source: Home / Self Care | Attending: Orthopedic Surgery

## 2018-01-08 ENCOUNTER — Ambulatory Visit (HOSPITAL_COMMUNITY): Payer: Medicaid - Out of State | Admitting: Anesthesiology

## 2018-01-08 DIAGNOSIS — Z8673 Personal history of transient ischemic attack (TIA), and cerebral infarction without residual deficits: Secondary | ICD-10-CM | POA: Insufficient documentation

## 2018-01-08 DIAGNOSIS — S82831A Other fracture of upper and lower end of right fibula, initial encounter for closed fracture: Secondary | ICD-10-CM | POA: Insufficient documentation

## 2018-01-08 DIAGNOSIS — Z91018 Allergy to other foods: Secondary | ICD-10-CM | POA: Insufficient documentation

## 2018-01-08 DIAGNOSIS — Y9389 Activity, other specified: Secondary | ICD-10-CM | POA: Insufficient documentation

## 2018-01-08 DIAGNOSIS — G43909 Migraine, unspecified, not intractable, without status migrainosus: Secondary | ICD-10-CM | POA: Insufficient documentation

## 2018-01-08 DIAGNOSIS — Z885 Allergy status to narcotic agent status: Secondary | ICD-10-CM | POA: Insufficient documentation

## 2018-01-08 DIAGNOSIS — F419 Anxiety disorder, unspecified: Secondary | ICD-10-CM | POA: Insufficient documentation

## 2018-01-08 DIAGNOSIS — S82891A Other fracture of right lower leg, initial encounter for closed fracture: Secondary | ICD-10-CM

## 2018-01-08 DIAGNOSIS — F329 Major depressive disorder, single episode, unspecified: Secondary | ICD-10-CM | POA: Diagnosis not present

## 2018-01-08 DIAGNOSIS — W010XXA Fall on same level from slipping, tripping and stumbling without subsequent striking against object, initial encounter: Secondary | ICD-10-CM | POA: Diagnosis not present

## 2018-01-08 DIAGNOSIS — S82891D Other fracture of right lower leg, subsequent encounter for closed fracture with routine healing: Secondary | ICD-10-CM

## 2018-01-08 DIAGNOSIS — Z818 Family history of other mental and behavioral disorders: Secondary | ICD-10-CM | POA: Insufficient documentation

## 2018-01-08 DIAGNOSIS — Z820 Family history of epilepsy and other diseases of the nervous system: Secondary | ICD-10-CM | POA: Diagnosis not present

## 2018-01-08 DIAGNOSIS — J45909 Unspecified asthma, uncomplicated: Secondary | ICD-10-CM | POA: Diagnosis not present

## 2018-01-08 HISTORY — DX: Laryngeal spasm: J38.5

## 2018-01-08 HISTORY — DX: Pneumonia, unspecified organism: J18.9

## 2018-01-08 HISTORY — DX: Anemia, unspecified: D64.9

## 2018-01-08 HISTORY — PX: ORIF ANKLE FRACTURE: SHX5408

## 2018-01-08 LAB — POCT PREGNANCY, URINE: Preg Test, Ur: NEGATIVE

## 2018-01-08 LAB — CBC
HCT: 38.9 % (ref 36.0–46.0)
Hemoglobin: 11.9 g/dL — ABNORMAL LOW (ref 12.0–15.0)
MCH: 29.2 pg (ref 26.0–34.0)
MCHC: 30.6 g/dL (ref 30.0–36.0)
MCV: 95.3 fL (ref 80.0–100.0)
Platelets: 221 10*3/uL (ref 150–400)
RBC: 4.08 MIL/uL (ref 3.87–5.11)
RDW: 13.1 % (ref 11.5–15.5)
WBC: 8.6 10*3/uL (ref 4.0–10.5)
nRBC: 0 % (ref 0.0–0.2)

## 2018-01-08 LAB — BASIC METABOLIC PANEL
ANION GAP: 8 (ref 5–15)
BUN: 11 mg/dL (ref 6–20)
CALCIUM: 8.6 mg/dL — AB (ref 8.9–10.3)
CO2: 24 mmol/L (ref 22–32)
Chloride: 106 mmol/L (ref 98–111)
Creatinine, Ser: 0.76 mg/dL (ref 0.44–1.00)
GFR calc Af Amer: >60 mL/min (ref >60)
GFR calc non Af Amer: >60 mL/min (ref >60)
Glucose, Bld: 87 mg/dL (ref 70–99)
Potassium: 3.9 mmol/L (ref 3.5–5.1)
Sodium: 138 mmol/L (ref 135–145)

## 2018-01-08 SURGERY — OPEN REDUCTION INTERNAL FIXATION (ORIF) ANKLE FRACTURE
Anesthesia: General | Laterality: Right

## 2018-01-08 MED ORDER — ONDANSETRON HCL 4 MG/5ML PO SOLN
4.0000 mg | Freq: Once | ORAL | Status: DC
  Filled 2018-01-08: qty 5

## 2018-01-08 MED ORDER — DEXAMETHASONE SODIUM PHOSPHATE 10 MG/ML IJ SOLN
INTRAMUSCULAR | Status: DC | PRN
  Administered 2018-01-08: 10 mg via INTRAVENOUS

## 2018-01-08 MED ORDER — LACTATED RINGERS IV SOLN
INTRAVENOUS | Status: DC
  Administered 2018-01-08: 10:00:00 via INTRAVENOUS

## 2018-01-08 MED ORDER — 0.9 % SODIUM CHLORIDE (POUR BTL) OPTIME
TOPICAL | Status: DC | PRN
  Administered 2018-01-08: 1000 mL

## 2018-01-08 MED ORDER — MEPERIDINE HCL 50 MG/ML IJ SOLN: 6.2500 mg | INTRAMUSCULAR | Status: DC | PRN

## 2018-01-08 MED ORDER — BUPIVACAINE HCL (PF) 0.25 % IJ SOLN
INTRAMUSCULAR | Status: DC | PRN
  Administered 2018-01-08: 20 mL via EPIDURAL
  Administered 2018-01-08: 20 mL

## 2018-01-08 MED ORDER — ONDANSETRON HCL 4 MG/2ML IJ SOLN
INTRAMUSCULAR | Status: AC
  Administered 2018-01-08: 4 mg
  Filled 2018-01-08: qty 2

## 2018-01-08 MED ORDER — METOCLOPRAMIDE HCL 5 MG/ML IJ SOLN: 10.0000 mg | Freq: Once | INTRAMUSCULAR | Status: DC | PRN

## 2018-01-08 MED ORDER — LIDOCAINE 2% (20 MG/ML) 5 ML SYRINGE
INTRAMUSCULAR | Status: AC
  Filled 2018-01-08: qty 5

## 2018-01-08 MED ORDER — SCOPOLAMINE 1 MG/3DAYS TD PT72
1.0000 | MEDICATED_PATCH | TRANSDERMAL | Status: DC
  Administered 2018-01-08: 1.5 mg via TRANSDERMAL

## 2018-01-08 MED ORDER — PHENYLEPHRINE HCL 10 MG/ML IJ SOLN
INTRAMUSCULAR | Status: DC | PRN
  Administered 2018-01-08 (×3): 80 ug via INTRAVENOUS

## 2018-01-08 MED ORDER — LACTATED RINGERS IV SOLN: INTRAVENOUS | Status: DC

## 2018-01-08 MED ORDER — CHLORHEXIDINE GLUCONATE 4 % EX LIQD: 60.0000 mL | Freq: Once | CUTANEOUS | Status: DC

## 2018-01-08 MED ORDER — ONDANSETRON HCL 4 MG/2ML IJ SOLN
INTRAMUSCULAR | Status: DC | PRN
  Administered 2018-01-08: 4 mg via INTRAVENOUS

## 2018-01-08 MED ORDER — FENTANYL CITRATE (PF) 100 MCG/2ML IJ SOLN
INTRAMUSCULAR | Status: AC
  Administered 2018-01-08: 100 ug via INTRAVENOUS
  Filled 2018-01-08: qty 2

## 2018-01-08 MED ORDER — FENTANYL CITRATE (PF) 100 MCG/2ML IJ SOLN
100.0000 ug | Freq: Once | INTRAMUSCULAR | Status: AC
  Administered 2018-01-08: 100 ug via INTRAVENOUS

## 2018-01-08 MED ORDER — PROPOFOL 10 MG/ML IV BOLUS
INTRAVENOUS | Status: AC
  Filled 2018-01-08: qty 20

## 2018-01-08 MED ORDER — ONDANSETRON HCL 4 MG/2ML IJ SOLN
INTRAMUSCULAR | Status: AC
  Filled 2018-01-08: qty 2

## 2018-01-08 MED ORDER — LIDOCAINE 2% (20 MG/ML) 5 ML SYRINGE
INTRAMUSCULAR | Status: DC | PRN
  Administered 2018-01-08: 100 mg via INTRAVENOUS

## 2018-01-08 MED ORDER — FENTANYL CITRATE (PF) 100 MCG/2ML IJ SOLN: 25.0000 ug | INTRAMUSCULAR | Status: DC | PRN

## 2018-01-08 MED ORDER — FENTANYL CITRATE (PF) 250 MCG/5ML IJ SOLN
INTRAMUSCULAR | Status: AC
  Filled 2018-01-08: qty 5

## 2018-01-08 MED ORDER — MIDAZOLAM HCL 2 MG/2ML IJ SOLN
2.0000 mg | Freq: Once | INTRAMUSCULAR | Status: AC
  Administered 2018-01-08: 2 mg via INTRAVENOUS

## 2018-01-08 MED ORDER — MIDAZOLAM HCL 2 MG/2ML IJ SOLN
INTRAMUSCULAR | Status: AC
  Administered 2018-01-08: 2 mg via INTRAVENOUS
  Filled 2018-01-08: qty 2

## 2018-01-08 MED ORDER — PROPOFOL 10 MG/ML IV BOLUS
INTRAVENOUS | Status: DC | PRN
  Administered 2018-01-08: 200 mg via INTRAVENOUS

## 2018-01-08 MED ORDER — EPHEDRINE SULFATE 50 MG/ML IJ SOLN
INTRAMUSCULAR | Status: DC | PRN
  Administered 2018-01-08: 5 mg via INTRAVENOUS

## 2018-01-08 MED ORDER — SCOPOLAMINE 1 MG/3DAYS TD PT72
MEDICATED_PATCH | TRANSDERMAL | Status: AC
  Filled 2018-01-08: qty 1

## 2018-01-08 MED ORDER — CEFAZOLIN SODIUM-DEXTROSE 2-4 GM/100ML-% IV SOLN
2.0000 g | INTRAVENOUS | Status: AC
  Administered 2018-01-08: 2 g via INTRAVENOUS
  Filled 2018-01-08: qty 100

## 2018-01-08 MED ORDER — DEXAMETHASONE SODIUM PHOSPHATE 10 MG/ML IJ SOLN
INTRAMUSCULAR | Status: AC
  Filled 2018-01-08: qty 1

## 2018-01-08 SURGICAL SUPPLY — 49 items
BANDAGE ESMARK 6X9 LF (GAUZE/BANDAGES/DRESSINGS) IMPLANT
BIT DRILL 2.5X110 QC LCP DISP (BIT) ×3 IMPLANT
BNDG COHESIVE 4X5 TAN STRL (GAUZE/BANDAGES/DRESSINGS) ×3 IMPLANT
BNDG ESMARK 6X9 LF (GAUZE/BANDAGES/DRESSINGS)
BNDG GAUZE ELAST 4 BULKY (GAUZE/BANDAGES/DRESSINGS) ×3 IMPLANT
COVER SURGICAL LIGHT HANDLE (MISCELLANEOUS) ×3 IMPLANT
COVER WAND RF STERILE (DRAPES) IMPLANT
DRAPE OEC MINIVIEW 54X84 (DRAPES) ×3 IMPLANT
DRAPE U-SHAPE 47X51 STRL (DRAPES) ×3 IMPLANT
DRSG ADAPTIC 3X8 NADH LF (GAUZE/BANDAGES/DRESSINGS) ×3 IMPLANT
DRSG PAD ABDOMINAL 8X10 ST (GAUZE/BANDAGES/DRESSINGS) IMPLANT
DURAPREP 26ML APPLICATOR (WOUND CARE) ×3 IMPLANT
ELECT REM PT RETURN 9FT ADLT (ELECTROSURGICAL) ×3
ELECTRODE REM PT RTRN 9FT ADLT (ELECTROSURGICAL) ×1 IMPLANT
GAUZE SPONGE 4X4 12PLY STRL (GAUZE/BANDAGES/DRESSINGS) IMPLANT
GAUZE SPONGE 4X4 12PLY STRL LF (GAUZE/BANDAGES/DRESSINGS) ×3 IMPLANT
GLOVE BIOGEL PI IND STRL 9 (GLOVE) ×1 IMPLANT
GLOVE BIOGEL PI INDICATOR 9 (GLOVE) ×2
GLOVE SURG ORTHO 9.0 STRL STRW (GLOVE) ×3 IMPLANT
GOWN STRL REUS W/ TWL XL LVL3 (GOWN DISPOSABLE) ×3 IMPLANT
GOWN STRL REUS W/TWL XL LVL3 (GOWN DISPOSABLE) ×6
KIT BASIN OR (CUSTOM PROCEDURE TRAY) ×3 IMPLANT
KIT TURNOVER KIT B (KITS) ×3 IMPLANT
MANIFOLD NEPTUNE II (INSTRUMENTS) ×3 IMPLANT
NS IRRIG 1000ML POUR BTL (IV SOLUTION) ×3 IMPLANT
PACK ORTHO EXTREMITY (CUSTOM PROCEDURE TRAY) ×3 IMPLANT
PAD ABD 8X10 STRL (GAUZE/BANDAGES/DRESSINGS) ×3 IMPLANT
PAD ARMBOARD 7.5X6 YLW CONV (MISCELLANEOUS) IMPLANT
PLATE LCP 3.5 1/3 TUB 7HX81 (Plate) ×3 IMPLANT
SCREW CORTEX 3.5 12MM (Screw) ×2 IMPLANT
SCREW CORTEX 3.5 18MM (Screw) ×2 IMPLANT
SCREW CORTEX 3.5 20MM (Screw) ×2 IMPLANT
SCREW LOCK CORT ST 3.5X12 (Screw) ×1 IMPLANT
SCREW LOCK CORT ST 3.5X18 (Screw) ×1 IMPLANT
SCREW LOCK CORT ST 3.5X20 (Screw) ×1 IMPLANT
SCREW LOCK T15 FT 14X3.5X2.9X (Screw) ×1 IMPLANT
SCREW LOCK T15 FT 16X3.5X2.9X (Screw) ×1 IMPLANT
SCREW LOCKING 3.5X14 (Screw) ×2 IMPLANT
SCREW LOCKING 3.5X16 (Screw) ×2 IMPLANT
STAPLER VISISTAT 35W (STAPLE) IMPLANT
SUCTION FRAZIER HANDLE 10FR (MISCELLANEOUS) ×2
SUCTION TUBE FRAZIER 10FR DISP (MISCELLANEOUS) ×1 IMPLANT
SUT ETHILON 2 0 PSLX (SUTURE) ×6 IMPLANT
SUT VIC AB 2-0 CT1 27 (SUTURE)
SUT VIC AB 2-0 CT1 TAPERPNT 27 (SUTURE) IMPLANT
TOWEL OR 17X24 6PK STRL BLUE (TOWEL DISPOSABLE) ×3 IMPLANT
TOWEL OR 17X26 10 PK STRL BLUE (TOWEL DISPOSABLE) ×3 IMPLANT
TUBE CONNECTING 12'X1/4 (SUCTIONS) ×1
TUBE CONNECTING 12X1/4 (SUCTIONS) ×2 IMPLANT

## 2018-01-08 NOTE — Anesthesia Preprocedure Evaluation (Signed)
Anesthesia Evaluation  Patient identified by MRN, date of birth, ID band Patient awake    Reviewed: Allergy & Precautions, NPO status , Patient's Chart, lab work & pertinent test results  History of Anesthesia Complications (+) history of anesthetic complications  Airway Mallampati: II  TM Distance: >3 FB Neck ROM: Full    Dental no notable dental hx.    Pulmonary asthma ,    Pulmonary exam normal breath sounds clear to auscultation       Cardiovascular negative cardio ROS Normal cardiovascular exam Rhythm:Regular Rate:Normal     Neuro/Psych  Headaches, Anxiety Depression negative neurological ROS     GI/Hepatic negative GI ROS, Neg liver ROS,   Endo/Other  negative endocrine ROS  Renal/GU negative Renal ROS  negative genitourinary   Musculoskeletal negative musculoskeletal ROS (+)   Abdominal   Peds negative pediatric ROS (+)  Hematology negative hematology ROS (+)   Anesthesia Other Findings   Reproductive/Obstetrics negative OB ROS                             Anesthesia Physical  Anesthesia Plan  ASA: II  Anesthesia Plan: General   Post-op Pain Management:    Induction: Intravenous  PONV Risk Score and Plan: 3 and Ondansetron, Dexamethasone, Midazolam, Scopolamine patch - Pre-op and Treatment may vary due to age or medical condition  Airway Management Planned: LMA  Additional Equipment:   Intra-op Plan:   Post-operative Plan:   Informed Consent:   Plan Discussed with: CRNA and Surgeon  Anesthesia Plan Comments: ( )        Anesthesia Quick Evaluation

## 2018-01-08 NOTE — Op Note (Signed)
01/08/2018  11:14 AM  PATIENT:  Rebecca Dougherty    PRE-OPERATIVE DIAGNOSIS:  Right Ankle Fracture, Weber B fibular fracture.  POST-OPERATIVE DIAGNOSIS:  Same  PROCEDURE:  OPEN REDUCTION INTERNAL FIXATION (ORIF) RIGHT ANKLE FRACTURE , .  C-arm fluoroscopy utilized for post reduction evaluation..  SURGEON:  Nadara MustardMarcus V Denise Bramblett, MD  PHYSICIAN ASSISTANT:None ANESTHESIA:   General  PREOPERATIVE INDICATIONS:  Rebecca Dougherty is a  47 y.o. female with a diagnosis of Right Ankle Fracture who failed conservative measures and elected for surgical management.    The risks benefits and alternatives were discussed with the patient preoperatively including but not limited to the risks of infection, bleeding, nerve injury, cardiopulmonary complications, the need for revision surgery, among others, and the patient was willing to proceed.  OPERATIVE IMPLANTS: 7 hole Synthes plate.  @ENCIMAGES @  OPERATIVE FINDINGS: Postreduction congruent mortise.  OPERATIVE PROCEDURE: Patient was brought the operating room and underwent a general anesthetic.  After adequate levels anesthesia obtained patient's right lower extremity was prepped using DuraPrep draped into a sterile field a timeout was called.  Patient received preoperative antibiotics.  A lateral incision was made over the fibula this was carried sharply down to bone.  Subperiosteal dissection was used to cleanse the fracture site.  This was irrigated with normal saline.  The fracture was then reduced and a 20 mm screw was used as an interfrag screw to stabilize the fracture.  A 7 hole one third tubular plate was then used posterior laterally as an antiglide plate this was secured proximally with 3 compression screws and distally with 2 locking screws.  C arm fluoroscopy verified a congruent mortise postoperatively.  The wound was again irrigated with normal saline subcu was closed using 2-0 Vicryl skin was closed using staples.  A sterile dressing was applied  patient was extubated taken the PACU in stable condition.   DISCHARGE PLANNING:  Antibiotic duration: Preoperative antibiotics  Weightbearing: Nonweightbearing on the right with elevation  Pain medication: Patient has a prescription at home for hydrocodone  Dressing care/ Wound VAC: Keep dressing clean and dry until follow-up  Ambulatory devices: Walker crutches or kneeling scooter  Discharge to: Home.  Follow-up: In the office 1 week post operative.

## 2018-01-08 NOTE — Transfer of Care (Signed)
Immediate Anesthesia Transfer of Care Note  Patient: Rebecca Dougherty  Procedure(s) Performed: OPEN REDUCTION INTERNAL FIXATION (ORIF) RIGHT ANKLE FRACTURE (Right )  Patient Location: PACU  Anesthesia Type:GA combined with regional for post-op pain  Level of Consciousness: awake, alert , oriented and patient cooperative  Airway & Oxygen Therapy: Patient Spontanous Breathing and Patient connected to nasal cannula oxygen  Post-op Assessment: Report given to RN, Post -op Vital signs reviewed and stable and Patient moving all extremities  Post vital signs: Reviewed and stable  Last Vitals:  Vitals Value Taken Time  BP 110/74 01/08/2018 11:30 AM  Temp 36.3 C 01/08/2018 11:30 AM  Pulse 58 01/08/2018 11:33 AM  Resp 11 01/08/2018 11:33 AM  SpO2 100 % 01/08/2018 11:33 AM  Vitals shown include unvalidated device data.  Last Pain:  Vitals:   01/08/18 1130  PainSc: 0-No pain      Patients Stated Pain Goal: 0 (01/08/18 0850)  Complications: No apparent anesthesia complications

## 2018-01-08 NOTE — Anesthesia Procedure Notes (Signed)
Anesthesia Regional Block: Popliteal block   Pre-Anesthetic Checklist: ,, timeout performed, Correct Patient, Correct Site, Correct Laterality, Correct Procedure, Correct Position, site marked, Risks and benefits discussed,  Surgical consent,  Pre-op evaluation,  At surgeon's request and post-op pain management  Laterality: Right and Lower  Prep: Maximum Sterile Barrier Precautions used, chloraprep       Needles:  Injection technique: Single-shot  Needle Type: Echogenic Stimulator Needle     Needle Length: 10cm      Additional Needles:   Procedures:,,,, ultrasound used (permanent image in chart),,,,  Narrative:  Start time: 01/08/2018 9:50 AM End time: 01/08/2018 9:57 AM Injection made incrementally with aspirations every 5 mL.  Performed by: Personally  Anesthesiologist: Phillips Groutarignan, Nanci Lakatos, MD  Additional Notes: Risks, benefits and alternative to block explained extensively.  Patient tolerated procedure well, without complications.

## 2018-01-08 NOTE — Anesthesia Procedure Notes (Signed)
Anesthesia Regional Block: Adductor canal block   Pre-Anesthetic Checklist: ,, timeout performed, Correct Patient, Correct Site, Correct Laterality, Correct Procedure, Correct Position, site marked, Risks and benefits discussed,  Surgical consent,  Pre-op evaluation,  At surgeon's request and post-op pain management  Laterality: Right and Lower  Prep: Maximum Sterile Barrier Precautions used, chloraprep       Needles:  Injection technique: Single-shot  Needle Type: Echogenic Stimulator Needle     Needle Length: 10cm      Additional Needles:   Procedures:,,,, ultrasound used (permanent image in chart),,,,  Narrative:  Start time: 01/08/2018 9:41 AM End time: 01/08/2018 9:46 AM Injection made incrementally with aspirations every 5 mL.  Performed by: Personally  Anesthesiologist: Phillips Groutarignan, Jadrien Narine, MD  Additional Notes: Risks, benefits and alternative to block explained extensively.  Patient tolerated procedure well, without complications.

## 2018-01-08 NOTE — Anesthesia Postprocedure Evaluation (Signed)
Anesthesia Post Note  Patient: Armed forces operational officerChrystine Jelinek  Procedure(s) Performed: OPEN REDUCTION INTERNAL FIXATION (ORIF) RIGHT ANKLE FRACTURE (Right )     Patient location during evaluation: PACU Anesthesia Type: General Level of consciousness: awake and alert Pain management: pain level controlled Vital Signs Assessment: post-procedure vital signs reviewed and stable Respiratory status: spontaneous breathing, nonlabored ventilation, respiratory function stable and patient connected to nasal cannula oxygen Cardiovascular status: blood pressure returned to baseline and stable Postop Assessment: no apparent nausea or vomiting Anesthetic complications: no    Last Vitals:  Vitals:   01/08/18 1210 01/08/18 1223  BP: 114/70 109/72  Pulse: 73 64  Resp: 17 17  Temp: (!) 36.3 C   SpO2: 100% 100%    Last Pain:  Vitals:   01/08/18 1210  PainSc: 0-No pain                 Phillips Groutarignan, Morayo Leven

## 2018-01-08 NOTE — Progress Notes (Signed)
Orthopedic Tech Progress Note Patient Details:  Rebecca Dougherty 1970-06-05 [id number]  Ortho Devices Type of Ortho Device: CAM walker Ortho Device/Splint Interventions: Application   Post Interventions Patient Tolerated: Well Instructions Provided: Care of device   Saul FordyceJennifer C Gauge Winski 01/08/2018, 12:23 PM

## 2018-01-08 NOTE — Anesthesia Procedure Notes (Signed)
Procedure Name: LMA Insertion Date/Time: 01/08/2018 10:43 AM Performed by: Lucinda Dellecarlo, Medhansh Brinkmeier M, CRNA Pre-anesthesia Checklist: Patient identified, Emergency Drugs available, Suction available and Patient being monitored Patient Re-evaluated:Patient Re-evaluated prior to induction Oxygen Delivery Method: Circle System Utilized Preoxygenation: Pre-oxygenation with 100% oxygen Induction Type: IV induction Ventilation: Mask ventilation without difficulty LMA: LMA inserted LMA Size: 4.0 Number of attempts: 1 Airway Equipment and Method: Bite block Placement Confirmation: positive ETCO2 and breath sounds checked- equal and bilateral Tube secured with: Tape Dental Injury: Teeth and Oropharynx as per pre-operative assessment

## 2018-01-08 NOTE — H&P (Addendum)
Rebecca Dougherty is an 47 y.o. female.   Chief Complaint: Right ankle fracture, closed distal fibular fracture HPI: The patient is a 47 yo female who fell on a wet hill on 01/01/18. She was seen in the ED an Splinted. Due to insurance issues we saw the patient and she is admitted for ORIF of her right closed distal fibular fracture.   Past Medical History:  Diagnosis Date  . Anemia    years ago  . Anxiety   . Asthma    allergy induced  . Complication of anesthesia    pt has hypotension during anesthesia, woke during the procedure but couldn't open eyes and couldn;t move  . Depression   . Hypotension   . Laryngospasms   . Migraines    migraines  . Pneumonia    walking pneumonia  . Stroke [facility](HCC)    TIA's due to migraines  November, 2019    Past Surgical History:  Procedure Laterality Date  . BREAST REDUCTION SURGERY    . KNEE ARTHROSCOPY Right 10/22/2016   Procedure: Right Knee Arthroscopy and Debridement;  Surgeon: Nadara Mustarduda, Marcus V, MD;  [facility];  Service: Orthopedics;  Laterality: Right;  . KNEE ARTHROSCOPY Left   . KNEE ARTHROSCOPY Right   . KNEE ARTHROSCOPY Right   . MENISCUS REPAIR Right   . MENISCUS REPAIR Left     Family History  Problem Relation Age of Onset  . Alzheimer's disease Mother   . Anxiety disorder Mother    Social History:  reports that she has never smoked. She has never used smokeless tobacco. She reports that she does not drink alcohol or use drugs.  Allergies:  Allergies  Allergen Reactions  . Apple Anaphylaxis  . Percocet [Oxycodone-Acetaminophen] Nausea And Vomiting, Nausea Only and Other (See Comments)    EXTREME NAUSEA to the point of throwing up (left patient with a "hangover," also)  . Morphine And Related Other (See Comments)    HYPOTHERMIA    No medications prior to admission.    No results found for this or any previous visit (from the past 48 hour(s)). No results found.  Review of Systems  All other systems reviewed and  are negative.   Last menstrual period 12/12/2017. Physical Exam  Constitutional: She is oriented to person, place, and time. She appears well-developed and well-nourished. No distress.  HENT:  Head: Normocephalic and atraumatic.  Neck: Normal range of motion. Neck supple. No tracheal deviation present. No thyromegaly present.  Cardiovascular: Normal rate, regular rhythm, normal heart sounds and intact distal pulses.  Respiratory: Effort normal. No respiratory distress.  GI: Soft.  Musculoskeletal:     Comments: Right ankle with moderate localized edema. Pain to palpation over the lateral joint. Skin intact. Neurovascular intact.   Neurological: She is alert and oriented to person, place, and time. No cranial nerve deficit. She exhibits normal muscle tone. Coordination normal.     Assessment/Plan Closed right distal fibular fracture- Plan for OR today for ORIF of the right ankle fracture.   H. J. HeinzAYBURN,Shianna Bally,PA-C [facility] [phone]

## 2018-01-09 ENCOUNTER — Encounter (INDEPENDENT_AMBULATORY_CARE_PROVIDER_SITE_OTHER): Payer: Self-pay | Admitting: Orthopedic Surgery

## 2018-01-09 NOTE — Progress Notes (Signed)
Office Visit Note   Patient: Rebecca Dougherty           Date of Birth: August 05, 1970           MRN: [id number] Visit Date: 01/07/2018              Requested by: No referring provider defined for this encounter. PCP: System, Pcp Not In  Chief Complaint  Patient presents with  . Right Ankle - Pain      HPI: Patient is a 47 year old woman who was walking on a hill in the rain last Friday when she fell, she heard a crack had immediate pain and swelling.  Patient states that since she has been wearing the crutches she has been having left shoulder pain she states she felt a click in her shoulder.  Patient was scheduled for follow-up with Dr. Dion SaucierLandau and presents at this time for initial evaluation.  Assessment & Plan: Visit Diagnoses:  1. Acute pain of left shoulder   2. Closed fracture of right ankle, initial encounter     Plan: Patient has a displaced Weber B lateral malleolar fracture right ankle with non-congruent mortise.  Will plan for open reduction internal fixation of the ankle on Friday.  Nonweightbearing for 2 weeks risks and benefits were discussed including infection neurovascular injury DVT recommended aspirin a day for a month.  Follow-Up Instructions: Return in about 1 week (around 01/14/2018).   Ortho Exam  Patient is alert, oriented, no adenopathy, well-dressed, normal affect, normal respiratory effort. Examination patient has ecchymosis and bruising of the right foot and ankle.  There is swelling.  Her dorsalis pedis pulses faint due to the swelling.  Radiographs show a displaced Weber B lateral malleolus fracture her syndesmosis is nontender to palpation the proximal fibula is not tender to palpation.  There are no open wounds no blisters.  Imaging: No results found. No images are attached to the encounter.  Labs: No results found for: HGBA1C, ESRSEDRATE, CRP, LABURIC, REPTSTATUS, GRAMSTAIN, CULT, LABORGA   No results found for: ALBUMIN, PREALBUMIN,  LABURIC  Body mass index is 29.76 kg/m.  Orders:  Orders Placed This Encounter  Procedures  . XR Shoulder Left   Meds ordered this encounter  Medications  . HYDROcodone-acetaminophen (NORCO/VICODIN) 5-325 MG tablet    Sig: Take 1 tablet by mouth every 6 (six) hours as needed for moderate pain.    Dispense:  30 tablet    Refill:  0     Procedures: No procedures performed  Clinical Data: No additional findings.  ROS:  All other systems negative, except as noted in the HPI. Review of Systems  Objective: Vital Signs: Ht 5\' 7"  (1.702 m)   Wt 190 lb (86.2 kg)   LMP 12/12/2017 (Approximate)   BMI 29.76 kg/m   Specialty Comments:  No specialty comments available.  PMFS History: Patient Active Problem List   Diagnosis Date Noted  . Closed fracture of right ankle   . History of meniscal tear   . Arthritis of knee, right    Past Medical History:  Diagnosis Date  . Anemia    years ago  . Anxiety   . Asthma    allergy induced  . Complication of anesthesia    pt has hypotension during anesthesia, woke during the procedure but couldn't open eyes and couldn;t move  . Depression   . Hypotension   . Laryngospasms   . Migraines    migraines  . Pneumonia    walking pneumonia  .  Stroke [facility](HCC)    TIA's due to migraines  November, 2019    Family History  Problem Relation Age of Onset  . Alzheimer's disease Mother   . Anxiety disorder Mother     Past Surgical History:  Procedure Laterality Date  . BREAST REDUCTION SURGERY    . KNEE ARTHROSCOPY Right 10/22/2016   Procedure: Right Knee Arthroscopy and Debridement;  Surgeon: Nadara Mustarduda, Deni Berti V, MD;  [facility];  Service: Orthopedics;  Laterality: Right;  . KNEE ARTHROSCOPY Left   . KNEE ARTHROSCOPY Right   . KNEE ARTHROSCOPY Right   . MENISCUS REPAIR Right   . MENISCUS REPAIR Left    Social History   Occupational History  . Not on file  Tobacco Use  . Smoking status: Never Smoker  . Smokeless tobacco: Never  Used  Substance and Sexual Activity  . Alcohol use: No  . Drug use: Never  . Sexual activity: Not on file

## 2018-01-11 ENCOUNTER — Encounter (HOSPITAL_COMMUNITY): Payer: Self-pay | Admitting: Orthopedic Surgery

## 2018-01-14 ENCOUNTER — Telehealth (INDEPENDENT_AMBULATORY_CARE_PROVIDER_SITE_OTHER): Payer: Self-pay | Admitting: Orthopedic Surgery

## 2018-01-14 ENCOUNTER — Other Ambulatory Visit (INDEPENDENT_AMBULATORY_CARE_PROVIDER_SITE_OTHER): Payer: Self-pay | Admitting: Physician Assistant

## 2018-01-14 MED ORDER — ONDANSETRON 4 MG PO TBDP: 4.0000 mg | ORAL_TABLET | Freq: Three times a day (TID) | ORAL | 0 refills | Status: DC | PRN

## 2018-01-14 NOTE — Telephone Encounter (Signed)
Sent Rx for Zofran

## 2018-01-14 NOTE — Telephone Encounter (Signed)
Rebecca Dougherty called in requesting an Rx for Zofran.  She received some in the hospital because the Vicodin she takes for pain makes her nauseous.  [facility] [address]

## 2018-01-15 NOTE — Telephone Encounter (Signed)
I called and left message on voicemail that Rx was sent to her pharmacy.

## 2018-01-18 ENCOUNTER — Ambulatory Visit (INDEPENDENT_AMBULATORY_CARE_PROVIDER_SITE_OTHER): Payer: PRIVATE HEALTH INSURANCE | Admitting: Physician Assistant

## 2018-01-18 ENCOUNTER — Ambulatory Visit (INDEPENDENT_AMBULATORY_CARE_PROVIDER_SITE_OTHER): Payer: PRIVATE HEALTH INSURANCE

## 2018-01-18 ENCOUNTER — Encounter (INDEPENDENT_AMBULATORY_CARE_PROVIDER_SITE_OTHER): Payer: Self-pay | Admitting: Physician Assistant

## 2018-01-18 VITALS — Ht 67.0 in | Wt 190.0 lb

## 2018-01-18 DIAGNOSIS — S82891D Other fracture of right lower leg, subsequent encounter for closed fracture with routine healing: Secondary | ICD-10-CM

## 2018-01-18 DIAGNOSIS — M25571 Pain in right ankle and joints of right foot: Secondary | ICD-10-CM

## 2018-01-18 MED ORDER — PROMETHAZINE HCL 12.5 MG PO TABS: 12.5000 mg | ORAL_TABLET | Freq: Four times a day (QID) | ORAL | 0 refills | Status: DC | PRN

## 2018-01-18 MED ORDER — HYDROCODONE-ACETAMINOPHEN 5-325 MG PO TABS: 1.0000 | ORAL_TABLET | Freq: Four times a day (QID) | ORAL | 0 refills | Status: DC | PRN

## 2018-01-19 ENCOUNTER — Encounter (INDEPENDENT_AMBULATORY_CARE_PROVIDER_SITE_OTHER): Payer: Self-pay | Admitting: Physician Assistant

## 2018-01-19 NOTE — Progress Notes (Signed)
Office Visit Note   Patient: Rebecca Dougherty           Date of Birth: September 23, 1970           MRN: [id number] Visit Date: 01/18/2018              Requested by: No referring provider defined for this encounter. PCP: System, Pcp Not In  Chief Complaint  Patient presents with  . Right Ankle - Routine Post Op    01/08/18 right ORIF ankle fracture       HPI: The patient is a 47 year old woman here with her husband for postoperative follow-up following open reduction internal fixation of her right ankle fracture on 01/08/2018.  She reports she has been in a fracture boot and doing some rare weightbearing in her fracture boot with crutches.  She has had a lot of nausea with the pain medication but was unable to get Zofran due to cost issues and requests something else for nausea today.  She has been trying to elevate the leg as much as possible.  She has been using some Arnica [place] for the bruising over the incisional ankle area and reports that this is much improved.  Assessment & Plan: Visit Diagnoses:  1. Closed fracture of right ankle with routine healing, subsequent encounter   2. Pain in right ankle and joints of right foot     Plan: Sutures were harvested this visit and the patient can begin showering and getting the incisional area wet and cleaning with soap and water.  Counseled the patient that she can begin partial weightbearing in her fracture boot utilizing her crutches and gradually bear more weight as she is able to tolerate if she is not having significant pain issues.  She can begin some gentle range of motion at the ankle out of her boot..  She should continue elevating as much as possible.  We did refill her hydrocodone and ordered some Phenergan for use as needed for nausea.  Cautioned patient that Phenergan may be quite sedating and that should she should be aware of this whenever she takes this medication.  She is not currently driving and should not resume driving at  this point.  She will follow-up here in 3 weeks with follow-up radiographs at that time.  Follow-Up Instructions: Return in about 3 weeks (around 02/08/2018).   Ortho Exam  Patient is alert, oriented, no adenopathy, well-dressed, normal affect, normal respiratory effort. The right ankle incision is healing well and sutures removed this visit.  She has minimal bruising and edema about the ankle and foot.  She is neurovascularly intact distally.  Imaging: Xr Ankle Complete Right  Result Date: 01/19/2018 The radiographs of the right ankle show good position and alignment of the fracture with ankle mortise congruent and fibula out to length  No images are attached to the encounter.  Labs: No results found for: HGBA1C, ESRSEDRATE, CRP, LABURIC, REPTSTATUS, GRAMSTAIN, CULT, LABORGA   No results found for: ALBUMIN, PREALBUMIN, LABURIC  Body mass index is 29.76 kg/m.  Orders:  Orders Placed This Encounter  Procedures  . XR Ankle Complete Right   Meds ordered this encounter  Medications  . HYDROcodone-acetaminophen (NORCO/VICODIN) 5-325 MG tablet    Sig: Take 1 tablet by mouth every 6 (six) hours as needed for moderate pain.    Dispense:  30 tablet    Refill:  0  . promethazine (PHENERGAN) 12.5 MG tablet    Sig: Take 1 tablet (12.5 mg total) by  mouth every 6 (six) hours as needed for nausea or vomiting.    Dispense:  30 tablet    Refill:  0     Procedures: No procedures performed  Clinical Data: No additional findings.  ROS:  All other systems negative, except as noted in the HPI. Review of Systems  Objective: Vital Signs: Ht 5\' 7"  (1.702 m)   Wt 190 lb (86.2 kg)   BMI 29.76 kg/m   Specialty Comments:  No specialty comments available.  PMFS History: Patient Active Problem List   Diagnosis Date Noted  . Closed fracture of right ankle   . History of meniscal tear   . Arthritis of knee, right    Past Medical History:  Diagnosis Date  . Anemia    years ago    . Anxiety   . Asthma    allergy induced  . Complication of anesthesia    pt has hypotension during anesthesia, woke during the procedure but couldn't open eyes and couldn;t move  . Depression   . Hypotension   . Laryngospasms   . Migraines    migraines  . Pneumonia    walking pneumonia  . Stroke [facility](HCC)    TIA's due to migraines  November, 2019    Family History  Problem Relation Age of Onset  . Alzheimer's disease Mother   . Anxiety disorder Mother     Past Surgical History:  Procedure Laterality Date  . BREAST REDUCTION SURGERY    . KNEE ARTHROSCOPY Right 10/22/2016   Procedure: Right Knee Arthroscopy and Debridement;  Surgeon: Nadara Mustarduda, Marcus V, MD;  [facility];  Service: Orthopedics;  Laterality: Right;  . KNEE ARTHROSCOPY Left   . KNEE ARTHROSCOPY Right   . KNEE ARTHROSCOPY Right   . MENISCUS REPAIR Right   . MENISCUS REPAIR Left   . ORIF ANKLE FRACTURE Right 01/08/2018   Procedure: OPEN REDUCTION INTERNAL FIXATION (ORIF) RIGHT ANKLE FRACTURE;  Surgeon: Nadara Mustarduda, Marcus V, MD;  [facility];  Service: Orthopedics;  Laterality: Right;   Social History   Occupational History  . Not on file  Tobacco Use  . Smoking status: Never Smoker  . Smokeless tobacco: Never Used  Substance and Sexual Activity  . Alcohol use: No  . Drug use: Never  . Sexual activity: Not on file

## 2018-02-08 ENCOUNTER — Ambulatory Visit (INDEPENDENT_AMBULATORY_CARE_PROVIDER_SITE_OTHER): Payer: Self-pay

## 2018-02-08 ENCOUNTER — Ambulatory Visit (INDEPENDENT_AMBULATORY_CARE_PROVIDER_SITE_OTHER): Payer: PRIVATE HEALTH INSURANCE | Admitting: Physician Assistant

## 2018-02-08 ENCOUNTER — Encounter (INDEPENDENT_AMBULATORY_CARE_PROVIDER_SITE_OTHER): Payer: Self-pay | Admitting: Physician Assistant

## 2018-02-08 VITALS — Ht 67.0 in | Wt 190.0 lb

## 2018-02-08 DIAGNOSIS — S82891D Other fracture of right lower leg, subsequent encounter for closed fracture with routine healing: Secondary | ICD-10-CM

## 2018-02-08 MED ORDER — HYDROCODONE-ACETAMINOPHEN 5-325 MG PO TABS: 1.0000 | ORAL_TABLET | Freq: Four times a day (QID) | ORAL | 0 refills | Status: DC | PRN

## 2018-02-08 NOTE — Progress Notes (Addendum)
Office Visit Note   Patient: Rebecca Dougherty           Date of Birth: [birth year]           MRN: [id number] Visit Date: 02/08/2018              Requested by: No referring provider defined for this encounter. PCP: System, Pcp Not In  Chief Complaint  Patient presents with  . Right Ankle - Routine Post Op    01/08/18 right ankle ORIF       HPI: The patient is a 48 yo woman here for post operative follow up following ORIF of her right ankle fracture on 01/08/2018. She reports she is wearing the fracture boot and walking partial weight bearing with crutches. She is having some nerve pain over the incisional area and the dorsum of the foot and has started wearing compression socks and this is helping.   Assessment & Plan: Visit Diagnoses:  1. Closed fracture of right ankle with routine healing, subsequent encounter     Plan:  Continue weight bearing as tolerated in fracture boot and with crutches. Continue ankle range of motion. Continue antibiotic ointment to the incisional area.Okay to use stationary pedaling equipment.  Follow up in 2 weeks with follow up radiographs.   Follow-Up Instructions: Return in about 2 weeks (around 02/22/2018).   Ortho Exam  Patient is alert, oriented, no adenopathy, well-dressed, normal affect, normal respiratory effort. The right lateral ankle incision has a small open area distally ~ 1.5 x 0.3 cm with good pink granulation tissue present. No signs of infection or cellulitis. Good pedal pulses. Good ankle range of motion. Mild edema localized to the ankle area.   Imaging: Xr Ankle Complete Right  Result Date: 02/08/2018 3 view of right ankle showed Good position and alignment of fracture with internal fixation in place.   No images are attached to the encounter.  Labs: No results found for: HGBA1C, ESRSEDRATE, CRP, LABURIC, REPTSTATUS, GRAMSTAIN, CULT, LABORGA   No results found for: ALBUMIN, PREALBUMIN, LABURIC  Body mass index is 29.76  kg/m.  Orders:  Orders Placed This Encounter  Procedures  . XR Ankle Complete Right   Meds ordered this encounter  Medications  . HYDROcodone-acetaminophen (NORCO/VICODIN) 5-325 MG tablet    Sig: Take 1 tablet by mouth every 6 (six) hours as needed for moderate pain.    Dispense:  30 tablet    Refill:  0     Procedures: No procedures performed  Clinical Data: No additional findings.  ROS:  All other systems negative, except as noted in the HPI. Review of Systems  Objective: Vital Signs: Ht 5\' 7"  (1.702 m)   Wt 190 lb (86.2 kg)   BMI 29.76 kg/m   Specialty Comments:  No specialty comments available.  PMFS History: Patient Active Problem List   Diagnosis Date Noted  . Closed fracture of right ankle   . History of meniscal tear   . Arthritis of knee, right    Past Medical History:  Diagnosis Date  . Anemia    years ago  . Anxiety   . Asthma    allergy induced  . Complication of anesthesia    pt has hypotension during anesthesia, woke during the procedure but couldn't open eyes and couldn;t move  . Depression   . Hypotension   . Laryngospasms   . Migraines    migraines  . Pneumonia    walking pneumonia  . Stroke [facility](HCC)  TIA's due to migraines  November, 2019    Family History  Problem Relation Age of Onset  . Alzheimer's disease Mother   . Anxiety disorder Mother     Past Surgical History:  Procedure Laterality Date  . BREAST REDUCTION SURGERY    . KNEE ARTHROSCOPY Right 10/22/2016   Procedure: Right Knee Arthroscopy and Debridement;  Surgeon: Nadara Mustard, MD;  Location: [facility] OR;  Service: Orthopedics;  Laterality: Right;  . KNEE ARTHROSCOPY Left   . KNEE ARTHROSCOPY Right   . KNEE ARTHROSCOPY Right   . MENISCUS REPAIR Right   . MENISCUS REPAIR Left   . ORIF ANKLE FRACTURE Right 01/08/2018   Procedure: OPEN REDUCTION INTERNAL FIXATION (ORIF) RIGHT ANKLE FRACTURE;  Surgeon: Nadara Mustard, MD;  Location: [facility] OR;  Service: Orthopedics;   Laterality: Right;   Social History   Occupational History  . Not on file  Tobacco Use  . Smoking status: Never Smoker  . Smokeless tobacco: Never Used  Substance and Sexual Activity  . Alcohol use: No  . Drug use: Never  . Sexual activity: Not on file

## 2018-02-22 ENCOUNTER — Ambulatory Visit (INDEPENDENT_AMBULATORY_CARE_PROVIDER_SITE_OTHER): Payer: PRIVATE HEALTH INSURANCE

## 2018-02-22 ENCOUNTER — Encounter (INDEPENDENT_AMBULATORY_CARE_PROVIDER_SITE_OTHER): Payer: Self-pay | Admitting: Physician Assistant

## 2018-02-22 ENCOUNTER — Ambulatory Visit (INDEPENDENT_AMBULATORY_CARE_PROVIDER_SITE_OTHER): Payer: PRIVATE HEALTH INSURANCE | Admitting: Orthopedic Surgery

## 2018-02-22 VITALS — Ht 67.0 in | Wt 190.0 lb

## 2018-02-22 DIAGNOSIS — S82891D Other fracture of right lower leg, subsequent encounter for closed fracture with routine healing: Secondary | ICD-10-CM

## 2018-02-22 NOTE — Progress Notes (Signed)
Office Visit Note   Patient: Rebecca Dougherty           Date of Birth: 05-08-1970           MRN: [id number] Visit Date: 02/22/2018              Requested by: No referring provider defined for this encounter. PCP: System, Pcp Not In  Chief Complaint  Patient presents with  . Right Ankle - Routine Post Op    01/08/18 right ankle ORIF       HPI: Patient is a 48 year old woman who presents 7 weeks status post open reduction internal fixation right Weber B fibular fracture.  Patient states she has a small open wound over the lateral ankle she states she still has some burning dorsally over her foot.  Assessment & Plan: Visit Diagnoses:  1. Closed fracture of right ankle with routine healing, subsequent encounter     Plan: Discussed that this was a sterile stitch abscess the Vicryl was removed.  She will continue with Dial soap cleansing a Band-Aid compression stocking and her ASO.  Recommended continue scar massage to decrease scar tissue irritating the superficial peroneal nerve and the sural nerve.  Patient will continue with ankle dorsiflexion exercises are massage range of motion and strengthening for her ankle.  3 view radiographs of the right ankle at follow-up.  Follow-Up Instructions: Return in about 4 weeks (around 03/22/2018).   Ortho Exam  Patient is alert, oriented, no adenopathy, well-dressed, normal affect, normal respiratory effort. Examination patient incision is healed well she has one area of a sterile abscess that 3 mm in diameter 1 mm deep with some Vicryl suture visible.  The Vicryl suture was removed there is no signs of infection no cellulitis.  Bactroban and a Band-Aid was applied.  Imaging: Xr Ankle Complete Right  Result Date: 02/22/2018 3 view radiographs of the right ankle shows a congruent mortise the hardware is intact no displacement of the fractures no complicating features.  No images are attached to the encounter.  Labs: No results found for:  HGBA1C, ESRSEDRATE, CRP, LABURIC, REPTSTATUS, GRAMSTAIN, CULT, LABORGA   No results found for: ALBUMIN, PREALBUMIN, LABURIC  Body mass index is 29.76 kg/m.  Orders:  Orders Placed This Encounter  Procedures  . XR Ankle Complete Right   No orders of the defined types were placed in this encounter.    Procedures: No procedures performed  Clinical Data: No additional findings.  ROS:  All other systems negative, except as noted in the HPI. Review of Systems  Objective: Vital Signs: Ht 5\' 7"  (1.702 m)   Wt 190 lb (86.2 kg)   BMI 29.76 kg/m   Specialty Comments:  No specialty comments available.  PMFS History: Patient Active Problem List   Diagnosis Date Noted  . Closed fracture of right ankle   . History of meniscal tear   . Arthritis of knee, right    Past Medical History:  Diagnosis Date  . Anemia    years ago  . Anxiety   . Asthma    allergy induced  . Complication of anesthesia    pt has hypotension during anesthesia, woke during the procedure but couldn't open eyes and couldn;t move  . Depression   . Hypotension   . Laryngospasms   . Migraines    migraines  . Pneumonia    walking pneumonia  . Stroke [facility](HCC)    TIA's due to migraines  November, 2019    Family History  Problem Relation Age of Onset  . Alzheimer's disease Mother   . Anxiety disorder Mother     Past Surgical History:  Procedure Laterality Date  . BREAST REDUCTION SURGERY    . KNEE ARTHROSCOPY Right 10/22/2016   Procedure: Right Knee Arthroscopy and Debridement;  Surgeon: Nadara Mustard, MD;  Location: [facility] OR;  Service: Orthopedics;  Laterality: Right;  . KNEE ARTHROSCOPY Left   . KNEE ARTHROSCOPY Right   . KNEE ARTHROSCOPY Right   . MENISCUS REPAIR Right   . MENISCUS REPAIR Left   . ORIF ANKLE FRACTURE Right 01/08/2018   Procedure: OPEN REDUCTION INTERNAL FIXATION (ORIF) RIGHT ANKLE FRACTURE;  Surgeon: Nadara Mustard, MD;  Location: [facility] OR;  Service: Orthopedics;  Laterality:  Right;   Social History   Occupational History  . Not on file  Tobacco Use  . Smoking status: Never Smoker  . Smokeless tobacco: Never Used  Substance and Sexual Activity  . Alcohol use: No  . Drug use: Never  . Sexual activity: Not on file

## 2018-03-22 ENCOUNTER — Ambulatory Visit (INDEPENDENT_AMBULATORY_CARE_PROVIDER_SITE_OTHER): Payer: Self-pay | Admitting: Orthopedic Surgery

## 2018-03-22 ENCOUNTER — Encounter (INDEPENDENT_AMBULATORY_CARE_PROVIDER_SITE_OTHER): Payer: Self-pay | Admitting: Orthopedic Surgery

## 2018-03-22 ENCOUNTER — Ambulatory Visit (INDEPENDENT_AMBULATORY_CARE_PROVIDER_SITE_OTHER): Payer: PRIVATE HEALTH INSURANCE

## 2018-03-22 VITALS — Ht 67.0 in | Wt 190.0 lb

## 2018-03-22 DIAGNOSIS — S82891D Other fracture of right lower leg, subsequent encounter for closed fracture with routine healing: Secondary | ICD-10-CM

## 2018-03-22 NOTE — Progress Notes (Signed)
Office Visit Note   Patient: Rebecca Dougherty           Date of Birth: [birth year]           MRN: [id number] Visit Date: 03/22/2018              Requested by: No referring provider defined for this encounter. PCP: System, Pcp Not In  Chief Complaint  Patient presents with  . Right Ankle - Pain, Follow-up      HPI: Patient is a 48 year old woman who presents about 2 and half months status post open reduction internal fixation Weber B fibular fracture on the right patient does have some swelling she states that the incision has just healed.  Assessment & Plan: Visit Diagnoses:  1. Closed fracture of right ankle with routine healing, subsequent encounter     Plan: Discussed the importance of Achilles stretching this was again demonstrated to her, discussed the importance of compression and exercise to decrease the swelling, discussed scar massage.  She has over-the-counter orthotics that should also help.  Follow-Up Instructions: Return if symptoms worsen or fail to improve.   Ortho Exam  Patient is alert, oriented, no adenopathy, well-dressed, normal affect, normal respiratory effort. Examination patient does have some swelling the incision is well-healed there is no opening or drainage no cellulitis.  She has very mild keloiding and she will work on scar massage she does have dorsiflexion to neutral with some Achilles tightness some tenderness over the origin the plantar fascia and some impingement anteriorly over the ankle.  Imaging: Xr Ankle Complete Right  Result Date: 03/22/2018 3 view radiographs of the right ankle shows stable internal fixation the mortise is congruent no complicating features.  No images are attached to the encounter.  Labs: No results found for: HGBA1C, ESRSEDRATE, CRP, LABURIC, REPTSTATUS, GRAMSTAIN, CULT, LABORGA   No results found for: ALBUMIN, PREALBUMIN, LABURIC  Body mass index is 29.76 kg/m.  Orders:  Orders Placed This Encounter    Procedures  . XR Ankle Complete Right   No orders of the defined types were placed in this encounter.    Procedures: No procedures performed  Clinical Data: No additional findings.  ROS:  All other systems negative, except as noted in the HPI. Review of Systems  Objective: Vital Signs: Ht 5\' 7"  (1.702 m)   Wt 190 lb (86.2 kg)   BMI 29.76 kg/m   Specialty Comments:  No specialty comments available.  PMFS History: Patient Active Problem List   Diagnosis Date Noted  . Closed fracture of right ankle   . History of meniscal tear   . Arthritis of knee, right    Past Medical History:  Diagnosis Date  . Anemia    years ago  . Anxiety   . Asthma    allergy induced  . Complication of anesthesia    pt has hypotension during anesthesia, woke during the procedure but couldn't open eyes and couldn;t move  . Depression   . Hypotension   . Laryngospasms   . Migraines    migraines  . Pneumonia    walking pneumonia  . Stroke [facility])    TIA's due to migraines  November, 2019    Family History  Problem Relation Age of Onset  . Alzheimer's disease Mother   . Anxiety disorder Mother     Past Surgical History:  Procedure Laterality Date  . BREAST REDUCTION SURGERY    . KNEE ARTHROSCOPY Right 10/22/2016   Procedure: Right Knee Arthroscopy and  Debridement;  Surgeon: Nadara Mustard, MD;  Location: [facility] OR;  Service: Orthopedics;  Laterality: Right;  . KNEE ARTHROSCOPY Left   . KNEE ARTHROSCOPY Right   . KNEE ARTHROSCOPY Right   . MENISCUS REPAIR Right   . MENISCUS REPAIR Left   . ORIF ANKLE FRACTURE Right 01/08/2018   Procedure: OPEN REDUCTION INTERNAL FIXATION (ORIF) RIGHT ANKLE FRACTURE;  Surgeon: Nadara Mustard, MD;  Location: [facility] OR;  Service: Orthopedics;  Laterality: Right;   Social History   Occupational History  . Not on file  Tobacco Use  . Smoking status: Never Smoker  . Smokeless tobacco: Never Used  Substance and Sexual Activity  . Alcohol use: No  . Drug  use: Never  . Sexual activity: Not on file

## 2018-03-30 ENCOUNTER — Other Ambulatory Visit (INDEPENDENT_AMBULATORY_CARE_PROVIDER_SITE_OTHER): Payer: Self-pay | Admitting: Orthopedic Surgery

## 2018-03-30 ENCOUNTER — Telehealth (INDEPENDENT_AMBULATORY_CARE_PROVIDER_SITE_OTHER): Payer: Self-pay | Admitting: Orthopedic Surgery

## 2018-03-30 MED ORDER — MELOXICAM 15 MG PO TABS: 15.0000 mg | ORAL_TABLET | Freq: Every day | ORAL | 3 refills | Status: DC

## 2018-03-30 NOTE — Telephone Encounter (Signed)
Patient called needing Rx refilled (Rebecca Dougherty) Patient said her doctor in [place] said Dr Lajoyce Corners would prescribe the medication for her. The number to contact patient is [phone]

## 2018-03-30 NOTE — Telephone Encounter (Signed)
01/08/18 pt is s/p an ORIF right ankle fx. She is requesting a refill of mobic please advise.

## 2018-03-30 NOTE — Telephone Encounter (Signed)
rx sent

## 2018-03-31 NOTE — Telephone Encounter (Signed)
Patient was called to pick-up Rx. 

## 2019-09-01 IMAGING — CR DG ANKLE COMPLETE 3+V*R*
3 series · 3 of 3 positions shown · non-contrast
Comparison: None.

CLINICAL DATA: 47-year-old female with right ankle pain after slip
and fall in the rain.

EXAM:
RIGHT ANKLE - COMPLETE 3+ VIEW

[ankle ap]
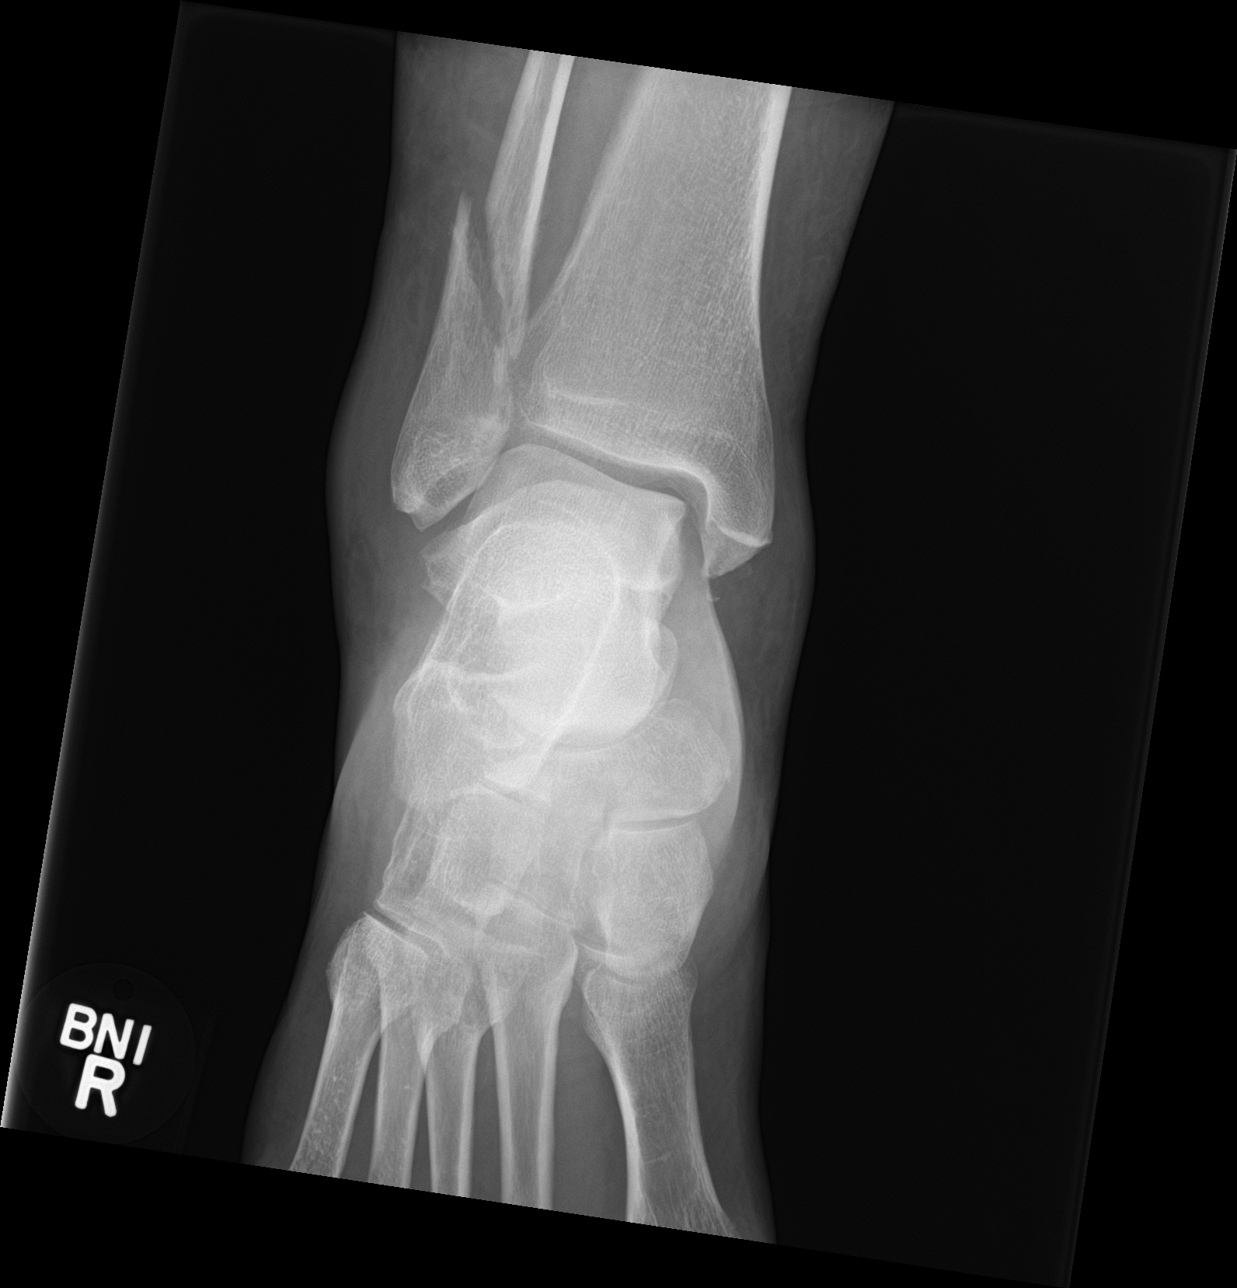

[ankle obl]
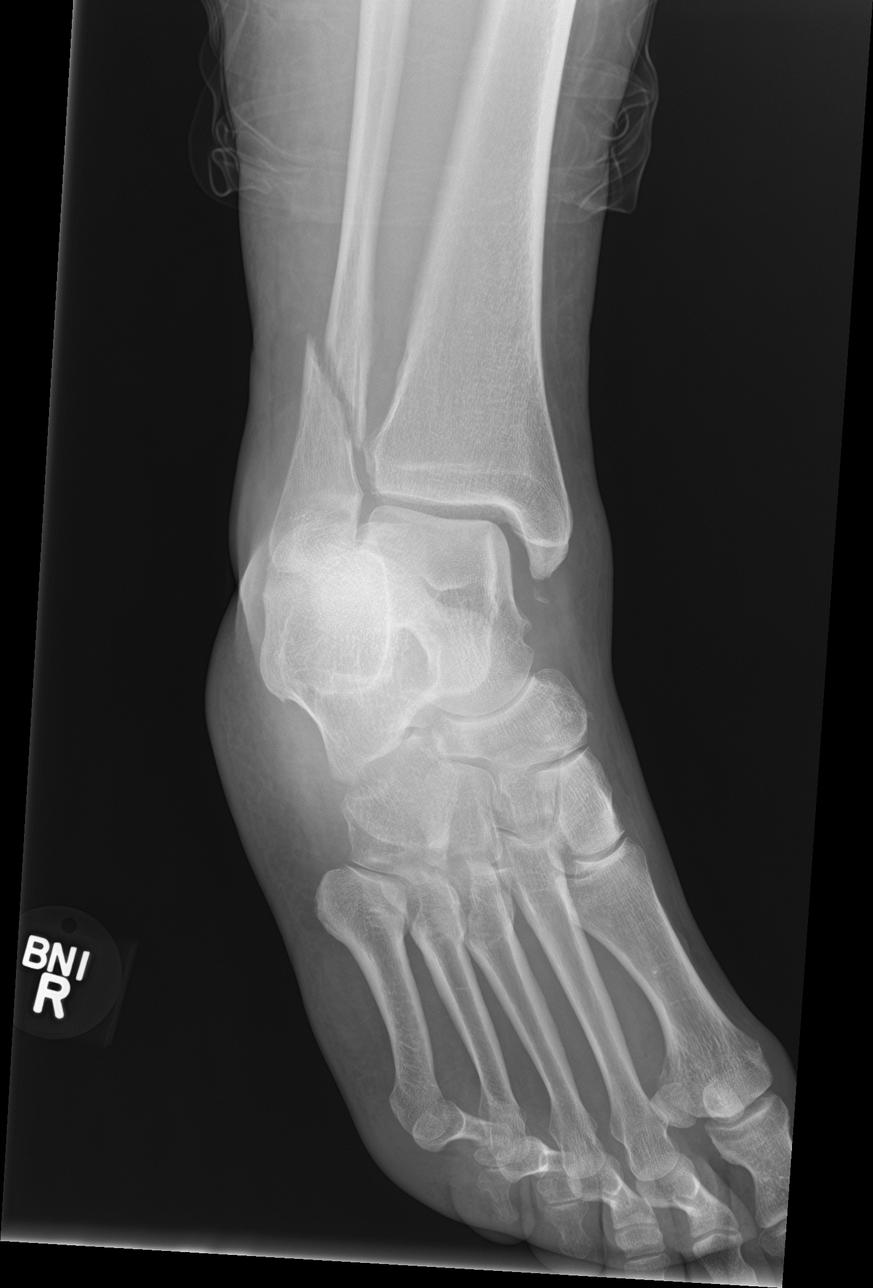

[ankle lat]
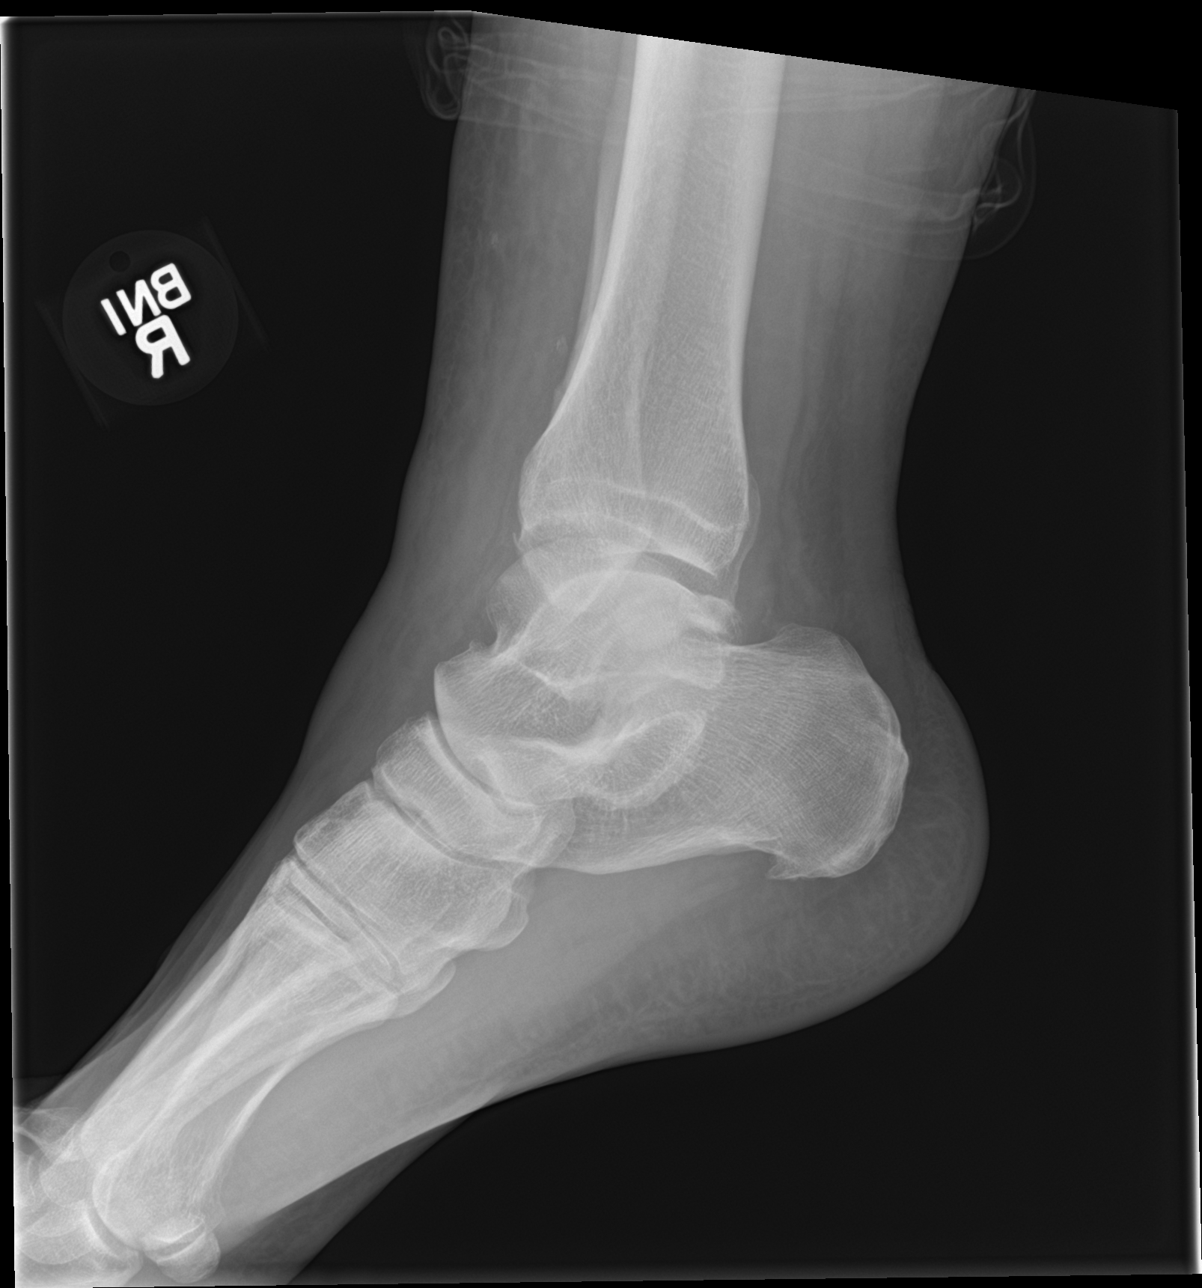

[3 of 3 positions shown; findings below may reference images not displayed]

FINDINGS: Oblique fracture through the distal right fibula metadiaphysis with
mild medial angulation and distraction. Mild lateral subluxation of
the mortise joint. Small ossific fragment at the tip of the medial
malleolus appears acute with a small adjacent donor site. Mild
anterior subluxation of the mortise joint. Talar dome intact.
Calcaneus appears intact. Other visible bones of the right foot
appear intact.
IMPRESSION: 1. Oblique fracture through the distal right fibula metadiaphysis
with mild medial angulation and distraction.
2. Small acute avulsion fracture at the tip of the medial malleolus.
3. Associated mild lateral and anterior subluxation of the mortise
joint.

## 2021-01-20 HISTORY — PX: OTHER SURGICAL HISTORY: SHX169

## 2022-07-28 ENCOUNTER — Other Ambulatory Visit: Payer: Self-pay | Admitting: Obstetrics and Gynecology

## 2022-07-28 DIAGNOSIS — Z975 Presence of (intrauterine) contraceptive device: Secondary | ICD-10-CM

## 2022-07-30 ENCOUNTER — Other Ambulatory Visit: Payer: Medicaid Other

## 2022-07-30 DIAGNOSIS — Z975 Presence of (intrauterine) contraceptive device: Secondary | ICD-10-CM

## 2022-08-01 ENCOUNTER — Encounter (HOSPITAL_BASED_OUTPATIENT_CLINIC_OR_DEPARTMENT_OTHER): Payer: Self-pay | Admitting: Obstetrics and Gynecology

## 2022-08-01 ENCOUNTER — Other Ambulatory Visit: Payer: Self-pay | Admitting: Obstetrics and Gynecology

## 2022-08-01 ENCOUNTER — Other Ambulatory Visit: Payer: Self-pay

## 2022-08-01 DIAGNOSIS — O3410 Maternal care for benign tumor of corpus uteri, unspecified trimester: Secondary | ICD-10-CM

## 2022-08-01 DIAGNOSIS — C55 Malignant neoplasm of uterus, part unspecified: Secondary | ICD-10-CM

## 2022-08-01 DIAGNOSIS — D259 Leiomyoma of uterus, unspecified: Secondary | ICD-10-CM

## 2022-08-01 NOTE — Progress Notes (Signed)
Spoke w/ via phone for pre-op interview---pt Lab needs dos---- cbc, t & s, urin preg              Lab results------none COVID test -----patient states asymptomatic no test needed Arrive at -------720  08-04-2022 NPO after MN NO Solid Food.  Clear liquids from MN until---620 am Med rec completed Medications to take morning of surgery -----albuterol inhaler prn/bring inhaler, pt wishes to not take wellbutrin Diabetic medication -----n/a Patient instructed no nail polish to be worn day of surgery Patient instructed to bring photo id and insurance card day of surgery Patient aware to have Driver (ride ) / caregiver  Jonny Ruiz [place] family   for 24 hours after surgery  Patient Special Instructions -----none Pre-Op special Instructions -----none Patient verbalized understanding of instructions that were given at this phone interview. Patient denies shortness of breath, chest pain, fever, cough at this phone interview.

## 2022-08-03 ENCOUNTER — Encounter (HOSPITAL_BASED_OUTPATIENT_CLINIC_OR_DEPARTMENT_OTHER): Payer: Self-pay | Admitting: Obstetrics and Gynecology

## 2022-08-03 NOTE — Anesthesia Preprocedure Evaluation (Addendum)
Anesthesia Evaluation  Patient identified by MRN, date of birth, ID band Patient awake    Reviewed: Allergy & Precautions, NPO status , Patient's Chart, lab work & pertinent test results  History of Anesthesia Complications (+) AWARENESS UNDER ANESTHESIA and history of anesthetic complications  Airway Mallampati: II       Dental no notable dental hx. (+) Teeth Intact   Pulmonary asthma , pneumonia, resolved   Pulmonary exam normal breath sounds clear to auscultation       Cardiovascular negative cardio ROS Normal cardiovascular exam Rhythm:Regular Rate:Normal     Neuro/Psych  Headaches PSYCHIATRIC DISORDERS Anxiety Depression       GI/Hepatic negative GI ROS, Neg liver ROS,,,  Endo/Other  Obesity  Renal/GU negative Renal ROS  negative genitourinary   Musculoskeletal  (+) Arthritis , Osteoarthritis,    Abdominal  (+) + obese  Peds  Hematology  (+) Blood dyscrasia, anemia   Anesthesia Other Findings   Reproductive/Obstetrics Mechanical complication of IUD- retained                             Anesthesia Physical Anesthesia Plan  ASA: 2  Anesthesia Plan:    Post-op Pain Management: Minimal or no pain anticipated   Induction: Intravenous  PONV Risk Score and Plan: 4 or greater and Midazolam, Treatment may vary due to age or medical condition, Ondansetron and Dexamethasone  Airway Management Planned: LMA  Additional Equipment: None  Intra-op Plan:   Post-operative Plan: Extubation in OR  Informed Consent: I have reviewed the patients History and Physical, chart, labs and discussed the procedure including the risks, benefits and alternatives for the proposed anesthesia with the patient or authorized representative who has indicated his/her understanding and acceptance.     Dental advisory given  Plan Discussed with: CRNA and Anesthesiologist  Anesthesia Plan Comments: (Use  BIS monitor )        Anesthesia Quick Evaluation

## 2022-08-04 ENCOUNTER — Other Ambulatory Visit: Payer: Self-pay

## 2022-08-04 ENCOUNTER — Ambulatory Visit (HOSPITAL_BASED_OUTPATIENT_CLINIC_OR_DEPARTMENT_OTHER): Payer: Medicaid Other | Admitting: Anesthesiology

## 2022-08-04 ENCOUNTER — Encounter (HOSPITAL_BASED_OUTPATIENT_CLINIC_OR_DEPARTMENT_OTHER)
Admission: RE | Disposition: A | Discharge status: Home / Self Care | Payer: Self-pay | Source: Home / Self Care | Attending: Obstetrics and Gynecology

## 2022-08-04 ENCOUNTER — Encounter (HOSPITAL_BASED_OUTPATIENT_CLINIC_OR_DEPARTMENT_OTHER): Payer: Self-pay | Admitting: Obstetrics and Gynecology

## 2022-08-04 ENCOUNTER — Ambulatory Visit (HOSPITAL_BASED_OUTPATIENT_CLINIC_OR_DEPARTMENT_OTHER)
Admission: RE | Admit: 2022-08-04 | Discharge: 2022-08-04 | Disposition: A | Discharge status: Home / Self Care | Payer: Medicaid Other | Attending: Obstetrics and Gynecology | Admitting: Obstetrics and Gynecology

## 2022-08-04 DIAGNOSIS — F32A Depression, unspecified: Secondary | ICD-10-CM | POA: Insufficient documentation

## 2022-08-04 DIAGNOSIS — X58XXXA Exposure to other specified factors, initial encounter: Secondary | ICD-10-CM | POA: Diagnosis not present

## 2022-08-04 DIAGNOSIS — J45909 Unspecified asthma, uncomplicated: Secondary | ICD-10-CM | POA: Insufficient documentation

## 2022-08-04 DIAGNOSIS — T8332XA Displacement of intrauterine contraceptive device, initial encounter: Secondary | ICD-10-CM | POA: Insufficient documentation

## 2022-08-04 DIAGNOSIS — Z30432 Encounter for removal of intrauterine contraceptive device: Secondary | ICD-10-CM

## 2022-08-04 DIAGNOSIS — Z01818 Encounter for other preprocedural examination: Secondary | ICD-10-CM

## 2022-08-04 DIAGNOSIS — F419 Anxiety disorder, unspecified: Secondary | ICD-10-CM | POA: Insufficient documentation

## 2022-08-04 HISTORY — PX: HYSTEROSCOPY: SHX211

## 2022-08-04 HISTORY — DX: Cluster headache syndrome, unspecified, not intractable: G44.009

## 2022-08-04 HISTORY — PX: IUD REMOVAL: SHX5392

## 2022-08-04 HISTORY — DX: Dependence on other enabling machines and devices: Z99.89

## 2022-08-04 LAB — GLUCOSE, CAPILLARY: Glucose-Capillary: 127 mg/dL — ABNORMAL HIGH (ref 70–99)

## 2022-08-04 LAB — CBC
HCT: 44.9 % (ref 36.0–46.0)
Hemoglobin: 14.2 g/dL (ref 12.0–15.0)
MCH: 28.9 pg (ref 26.0–34.0)
MCHC: 31.6 g/dL (ref 30.0–36.0)
MCV: 91.3 fL (ref 80.0–100.0)
Platelets: 246 10*3/uL (ref 150–400)
RBC: 4.92 MIL/uL (ref 3.87–5.11)
RDW: 13 % (ref 11.5–15.5)
WBC: 6.9 10*3/uL (ref 4.0–10.5)
nRBC: 0 % (ref 0.0–0.2)

## 2022-08-04 LAB — TYPE AND SCREEN
ABO/RH(D): A POS
Antibody Screen: NEGATIVE

## 2022-08-04 LAB — ABO/RH: ABO/RH(D): A POS

## 2022-08-04 LAB — POCT PREGNANCY, URINE: Preg Test, Ur: NEGATIVE

## 2022-08-04 SURGERY — HYSTEROSCOPY
Anesthesia: General

## 2022-08-04 MED ORDER — DROPERIDOL 2.5 MG/ML IJ SOLN: 0.6250 mg | Freq: Once | INTRAMUSCULAR | Status: DC | PRN

## 2022-08-04 MED ORDER — LIDOCAINE HCL (CARDIAC) PF 100 MG/5ML IV SOSY
PREFILLED_SYRINGE | INTRAVENOUS | Status: DC | PRN
  Administered 2022-08-04: 60 mg via INTRAVENOUS

## 2022-08-04 MED ORDER — FENTANYL CITRATE (PF) 100 MCG/2ML IJ SOLN
25.0000 ug | INTRAMUSCULAR | Status: DC | PRN
  Administered 2022-08-04: 25 ug via INTRAVENOUS

## 2022-08-04 MED ORDER — SODIUM CHLORIDE 0.9 % IR SOLN
Status: DC | PRN
  Administered 2022-08-04: 3000 mL

## 2022-08-04 MED ORDER — FENTANYL CITRATE (PF) 100 MCG/2ML IJ SOLN
INTRAMUSCULAR | Status: AC
  Filled 2022-08-04: qty 2

## 2022-08-04 MED ORDER — DEXAMETHASONE SODIUM PHOSPHATE 10 MG/ML IJ SOLN
INTRAMUSCULAR | Status: AC
  Filled 2022-08-04: qty 1

## 2022-08-04 MED ORDER — MIDAZOLAM HCL 2 MG/2ML IJ SOLN
INTRAMUSCULAR | Status: AC
  Filled 2022-08-04: qty 2

## 2022-08-04 MED ORDER — KETOROLAC TROMETHAMINE 30 MG/ML IJ SOLN
INTRAMUSCULAR | Status: DC | PRN
  Administered 2022-08-04: 30 mg via INTRAVENOUS

## 2022-08-04 MED ORDER — PROPOFOL 10 MG/ML IV BOLUS
INTRAVENOUS | Status: AC
  Filled 2022-08-04: qty 20

## 2022-08-04 MED ORDER — PROPOFOL 10 MG/ML IV BOLUS
INTRAVENOUS | Status: DC | PRN
  Administered 2022-08-04: 150 mg via INTRAVENOUS

## 2022-08-04 MED ORDER — IBUPROFEN 200 MG PO TABS: 600.0000 mg | ORAL_TABLET | Freq: Four times a day (QID) | ORAL | 0 refills | Status: DC | PRN

## 2022-08-04 MED ORDER — POVIDONE-IODINE 10 % EX SWAB: 2.0000 | Freq: Once | CUTANEOUS | Status: DC

## 2022-08-04 MED ORDER — LACTATED RINGERS IV SOLN: INTRAVENOUS | Status: DC

## 2022-08-04 MED ORDER — LIDOCAINE HCL (PF) 2 % IJ SOLN
INTRAMUSCULAR | Status: DC | PRN
  Administered 2022-08-04: 10 mL

## 2022-08-04 MED ORDER — KETOROLAC TROMETHAMINE 30 MG/ML IJ SOLN
INTRAMUSCULAR | Status: AC
  Filled 2022-08-04: qty 1

## 2022-08-04 MED ORDER — ONDANSETRON HCL 4 MG/2ML IJ SOLN
INTRAMUSCULAR | Status: DC | PRN
  Administered 2022-08-04: 4 mg via INTRAVENOUS

## 2022-08-04 MED ORDER — FENTANYL CITRATE (PF) 100 MCG/2ML IJ SOLN
INTRAMUSCULAR | Status: DC | PRN
  Administered 2022-08-04 (×2): 25 ug via INTRAVENOUS
  Administered 2022-08-04: 50 ug via INTRAVENOUS
  Administered 2022-08-04: 25 ug via INTRAVENOUS
  Administered 2022-08-04: 50 ug via INTRAVENOUS
  Administered 2022-08-04: 25 ug via INTRAVENOUS

## 2022-08-04 MED ORDER — ONDANSETRON HCL 4 MG/2ML IJ SOLN: 4.0000 mg | Freq: Once | INTRAMUSCULAR | Status: DC | PRN

## 2022-08-04 MED ORDER — DEXAMETHASONE SODIUM PHOSPHATE 10 MG/ML IJ SOLN
INTRAMUSCULAR | Status: DC | PRN
  Administered 2022-08-04: 5 mg via INTRAVENOUS

## 2022-08-04 MED ORDER — SILVER NITRATE-POT NITRATE 75-25 % EX MISC
CUTANEOUS | Status: DC | PRN
  Administered 2022-08-04: 2 via TOPICAL

## 2022-08-04 MED ORDER — MIDAZOLAM HCL 5 MG/5ML IJ SOLN
INTRAMUSCULAR | Status: DC | PRN
  Administered 2022-08-04: 2 mg via INTRAVENOUS

## 2022-08-04 MED ORDER — LIDOCAINE HCL (PF) 2 % IJ SOLN
INTRAMUSCULAR | Status: AC
  Filled 2022-08-04: qty 5

## 2022-08-04 MED ORDER — ONDANSETRON HCL 4 MG/2ML IJ SOLN
INTRAMUSCULAR | Status: AC
  Filled 2022-08-04: qty 2

## 2022-08-04 SURGICAL SUPPLY — 11 items
GLOVE BIO SURGEON STRL SZ 6.5 (GLOVE) ×2 IMPLANT
GLOVE BIOGEL PI IND STRL 7.0 (GLOVE) ×3 IMPLANT
GOWN STRL REUS W/TWL LRG LVL3 (GOWN DISPOSABLE) ×2 IMPLANT
KIT PROCEDURE FLUENT (KITS) ×1 IMPLANT
KIT TURNOVER CYSTO (KITS) ×1 IMPLANT
PACK VAGINAL MINOR WOMEN LF (CUSTOM PROCEDURE TRAY) ×1 IMPLANT
PAD OB MATERNITY 4.3X12.25 (PERSONAL CARE ITEMS) ×1 IMPLANT
SEAL ROD LENS SCOPE MYOSURE (ABLATOR) ×1 IMPLANT
SLEEVE SCD COMPRESS KNEE MED (STOCKING) ×1 IMPLANT
TOWEL OR 17X24 6PK STRL BLUE (TOWEL DISPOSABLE) ×2 IMPLANT
UNDERPAD 30X36 HEAVY ABSORB (UNDERPADS AND DIAPERS) ×1 IMPLANT

## 2022-08-04 NOTE — Transfer of Care (Signed)
Immediate Anesthesia Transfer of Care Note  Patient: Rebecca Dougherty  Procedure(s) Performed: Procedure(s) (LRB): HYSTEROSCOPY (N/A) INTRAUTERINE DEVICE (IUD) REMOVAL removal of PARTIAL IUD (N/A)  Patient Location: PACU  Anesthesia Type: General  Level of Consciousness: awake, sedated, patient cooperative and responds to stimulation  Airway & Oxygen Therapy: Patient Spontanous Breathing and Patient connected to [place] oxygen  Post-op Assessment: Report given to PACU RN, Post -op Vital signs reviewed and stable and Patient moving all extremities  Post vital signs: Reviewed and stable  Complications: No apparent anesthesia complications

## 2022-08-04 NOTE — Anesthesia Procedure Notes (Signed)
Procedure Name: LMA Insertion Date/Time: 08/04/2022 9:31 AM  Performed by: Jessica Priest, CRNAPre-anesthesia Checklist: Patient identified, Emergency Drugs available, Suction available, Patient being monitored and Timeout performed Patient Re-evaluated:Patient Re-evaluated prior to induction Oxygen Delivery Method: Circle system utilized Preoxygenation: Pre-oxygenation with 100% oxygen Induction Type: IV induction Ventilation: Mask ventilation without difficulty LMA: LMA inserted LMA Size: 4.0 Number of attempts: 1 Airway Equipment and Method: Bite block Placement Confirmation: positive ETCO2, CO2 detector and breath sounds checked- equal and bilateral Tube secured with: Tape Dental Injury: Teeth and Oropharynx as per pre-operative assessment

## 2022-08-04 NOTE — Discharge Instructions (Addendum)
No ibuprofen, Advil, Aleve, Motrin, ketorolac, meloxicam, naproxen, or other NSAIDS until after 4:00pm today if needed for pain.   DISCHARGE INSTRUCTIONS: HYSTEROSCOPY  The following instructions have been prepared to help you care for yourself upon your return home.   Personal hygiene:  Use sanitary pads for vaginal drainage, not tampons.  Shower the day after your procedure.  NO tub baths, pools or Jacuzzis for 2-3 weeks.  Wipe front to back after using the bathroom.  Activity and limitations:  Do NOT drive or operate any equipment for 24 hours. The effects of anesthesia are still present and drowsiness may result.  Do NOT rest in bed all day.  Walking is encouraged.  Walk up and down stairs slowly.  You may resume your normal activity in one to two days or as indicated by your physician. Sexual activity: NO intercourse for at least 2 weeks after the procedure, or as indicated by your Doctor.  Diet: Eat a light meal as desired this evening. You may resume your usual diet tomorrow.  Return to Work: You may resume your work activities in one to two days or as indicated by Therapist, sports.  What to expect after your surgery: Expect to have vaginal bleeding/discharge for 2-3 days and spotting for up to 10 days. It is not unusual to have soreness for up to 1-2 weeks. You may have a slight burning sensation when you urinate for the first day. Mild cramps may continue for a couple of days. You may have a regular period in 2-6 weeks.  Call your doctor for any of the following:  Excessive vaginal bleeding or clotting, saturating and changing one pad every hour.  Inability to urinate 6 hours after discharge from hospital.  Pain not relieved by pain medication.  Fever of 100.4 F or greater.  Unusual vaginal discharge or odor.    Post Anesthesia Home Care Instructions  Activity: Get plenty of rest for the remainder of the day. A responsible individual must stay with you for 24 hours  following the procedure.  For the next 24 hours, DO NOT: -Drive a car -Advertising copywriter -Drink alcoholic beverages -Take any medication unless instructed by your physician -Make any legal decisions or sign important papers.  Meals: Start with liquid foods such as gelatin or soup. Progress to regular foods as tolerated. Avoid greasy, spicy, heavy foods. If nausea and/or vomiting occur, drink only clear liquids until the nausea and/or vomiting subsides. Call your physician if vomiting continues.  Special Instructions/Symptoms: Your throat may feel dry or sore from the anesthesia or the breathing tube placed in your throat during surgery. If this causes discomfort, gargle with warm salt water. The discomfort should disappear within 24 hours.

## 2022-08-04 NOTE — Op Note (Signed)
Pre op DX: removal of intrauterine contraceptive device   Post Op ZO:[id number]   PHYSICIAN : Elfriede Bonini   ASSISTANTS: none   ANESTHESIA:   general LMA and paracervical block  ESTIMATED BLOOD LOSS: minimal  LOCAL MEDICATIONS USED:  LIDOCAINE 20CC  SPECIMEN:  Source of Specimen:  endometrial curettings  DISPOSITION OF SPECIMEN:  PATHOLOGY  COUNTS Correct:  YES    DICTATION #: The patient was taken to the operating room and prepped and draped in a normal sterile fashion. EUA done. Pt with 17 week irreg uterus c/w fibroids.    A bivalve speculum was placed into the vagina and anterior lip of the cervix was grasped with a single-tooth tenaculum.  10 cc of 2% lidocaine was used for cervical block.  the cervix was then dilated with Shawnie Pons dilators up to 21. The hysteroscope was placed into the uterine cavity. The  entire uterus and both ostia were visualized.   The arm of the IUD was embedded in the wall.  The grasper was placed in the uterus and the arm was removed.   Hyseroscope was then removed from the uterus. A sharp curettage was then done with a curette and endometrial curettings were obtained. The endometrial curettings were sent to pathology. Again the hysteroscope was placed into the uterine cavity. Both ostia were again visualized there were no polyps or submucosal fibroids  seen. The tenaculum was removed from the cervix and hemostasis was achieved with silver nitrate and pressure PLAN OF CARE: discharge to home  PATIENT DISPOSITION:  PACU - hemodynamically stable.

## 2022-08-04 NOTE — Anesthesia Postprocedure Evaluation (Signed)
Anesthesia Post Note  Patient: Rebecca Dougherty  Procedure(s) Performed: HYSTEROSCOPY INTRAUTERINE DEVICE (IUD) REMOVAL removal of PARTIAL IUD     Patient location during evaluation: PACU Anesthesia Type: General Level of consciousness: awake and alert and oriented Pain management: pain level controlled Vital Signs Assessment: post-procedure vital signs reviewed and stable Respiratory status: spontaneous breathing, nonlabored ventilation and respiratory function stable Cardiovascular status: blood pressure returned to baseline and stable Postop Assessment: no apparent nausea or vomiting Anesthetic complications: no   No notable events documented.  Last Vitals:  Vitals:   08/04/22 1030 08/04/22 1045  BP: 106/74 109/76  Pulse: 61 63  Resp: 14 13  Temp: (!) 36.3 C   SpO2: 98% 96%    Last Pain:  Vitals:   08/04/22 1045  TempSrc:   PainSc: 3    Pain Goal: Patients Stated Pain Goal: 3 (08/04/22 1045)                 Xela Oregel A.

## 2022-08-04 NOTE — H&P (Signed)
Rebecca Dougherty is an 52 y.o. female. Presents for removal of IUD pieceand EUA.  Pt presented for removal of IUD in the office.  Most came out instead of an arm.  Pt had a US demonstrated large degenrating fibroids pt did not know about.  Will do hysteroscopy and remove IUD piece.  Will do EUA to feel for the size of the urerus.   Pertinent Gynecological History: Menses: post-menopausal Bleeding: none Contraception: none Sexually transmitted diseases: no past history Previous GYN Procedures:  na   Last mammogram: normal Date: 2023 Last pap: normal Date: 2024 OB History: G1, P1   Menstrual History: Menarche age: 63 Patient's last menstrual period was 06/30/2022.    Past Medical History:  Diagnosis Date   Ambulates with cane    Anemia    years ago   Anxiety    Asthma    allergy induced   Cluster headaches    Complication of anesthesia    pt has hypotension during anesthesia, woke during the procedure but couldn't open eyes and couldn;t move   Depression    History of Laryngospasms    opens right up when relaxes self  last time occurred  2022 per pt   Hypotension    Migraines    migraines   Pneumonia    walking pneumonia yrs ago   Sindy Messing due to migraines  November, 2019, told by new neurologist 2023 no tias    Past Surgical History:  Procedure Laterality Date   BREAST REDUCTION SURGERY     HYSTEROSCOPY     yrs ago   KNEE ARTHROSCOPY Right 10/22/2016   Procedure: Right Knee Arthroscopy and Debridement;  Surgeon: Nadara Mustard, MD;  Location: [facility] OR;  Service: Orthopedics;  Laterality: Right;   KNEE ARTHROSCOPY Left    KNEE ARTHROSCOPY Right    KNEE ARTHROSCOPY Right    MENISCUS REPAIR Right    MENISCUS REPAIR Left    ORIF ANKLE FRACTURE Right 01/08/2018   Procedure: OPEN REDUCTION INTERNAL FIXATION (ORIF) RIGHT ANKLE FRACTURE;  Surgeon: Nadara Mustard, MD;  Location: [facility] OR;  Service: Orthopedics;  Laterality: Right;   right hand surgery  2023    Family  History  Problem Relation Age of Onset   Alzheimer's disease Mother    Anxiety disorder Mother     Social History:  reports that she has never smoked. She has never used smokeless tobacco. She reports that she does not drink alcohol and does not use drugs.  Allergies:  Allergies  Allergen Reactions   Apple Juice Anaphylaxis    Peel from apple   Percocet [Oxycodone-Acetaminophen] Nausea And Vomiting, Nausea Only and Other (See Comments)    EXTREME NAUSEA to the point of throwing up (left patient with a "hangover," also)   Morphine And Codeine Other (See Comments)    HYPOTHERMIA and does not help with pain    Medications Prior to Admission  Medication Sig Dispense Refill Last Dose   acetaminophen (TYLENOL) 500 MG tablet Take 1,000 mg by mouth 2 (two) times daily as needed (for pain).   08/03/2022   APPLE CIDER VINEGAR PO Take by mouth daily.   08/03/2022   b complex vitamins capsule Take 1 capsule by mouth daily.   08/03/2022   buPROPion (WELLBUTRIN XL) 150 MG 24 hr tablet Take 150 mg by mouth daily.   08/03/2022   cholecalciferol (VITAMIN D3) 25 MCG (1000 UT) tablet Take 5,000 Units by mouth daily. With vitamin k  08/03/2022   Creatinine POWD by Does not apply route. Creatiine 200 mg 2 dabs qd   08/03/2022   diphenhydrAMINE (BENADRYL) 50 MG capsule Take 50 mg by mouth every 6 (six) hours as needed.   08/03/2022   fluticasone (FLONASE) 50 MCG/ACT nasal spray Place 1 spray into both nostrils daily as needed for allergies.    Past Week   MAGNESIUM PO Take by mouth. Magnesium glycolate 400 mg 2 tabs daily   08/02/2022   MAGNESIUM PO Take by mouth. Magnesium cerenote 500 mg q day   08/03/2022   Melatonin 10 MG TABS Take by mouth at bedtime.   08/03/2022   meloxicam (MOBIC) 15 MG tablet Take 1 tablet (15 mg total) by mouth daily. 30 tablet 3 08/03/2022   metformin (FORTAMET) 500 MG (OSM) 24 hr tablet Take 500 mg by mouth daily with breakfast.   08/03/2022   NON FORMULARY Place 30 mg under the tongue  See admin instructions. CBD oil: Place 30 mg of oil under the tongue 1-2 times a day as needed for pain   Past Week   NON FORMULARY Take 1 tablet by mouth See admin instructions. Garden of Life Multivitamin for Women: Take 1 tablet by mouth once a day   08/03/2022   paragard intrauterine copper IUD IUD 1 each by Intrauterine route once. 2010 inserted      Probiotic Product (PROBIOTIC PO) Take 1 tablet by mouth daily.   08/03/2022   Turmeric (QC TUMERIC COMPLEX PO) Take 1,500 mg by mouth daily.   08/03/2022   UNABLE TO FIND Lions mame 2 tabs daily   08/03/2022   vitamin E 1000 UNIT capsule Take 1,000 Units by mouth daily.   08/02/2022   albuterol (VENTOLIN HFA) 108 (90 Base) MCG/ACT inhaler Inhale 2 puffs into the lungs every 6 (six) hours as needed for wheezing or shortness of breath (or seasonal allergies).   More than a month   HYDROcodone-acetaminophen (NORCO/VICODIN) 5-325 MG tablet Take 1 tablet by mouth every 6 (six) hours as needed for moderate pain. (Patient not taking: Reported on 08/01/2022) 30 tablet 0 Not Taking   ketotifen (ZADITOR) 0.025 % ophthalmic solution Place 1 drop into both eyes daily as needed (allergies).    08/02/2022   oxyCODONE-acetaminophen (PERCOCET/ROXICET) 5-325 MG tablet Take 1 tablet by mouth every 6 (six) hours as needed for severe pain. 10 tablet 0    promethazine (PHENERGAN) 12.5 MG tablet Take 1 tablet (12.5 mg total) by mouth every 6 (six) hours as needed for nausea or vomiting. 30 tablet 0     ROS  Blood pressure 133/79, pulse 82, temperature 97.6 F (36.4 C), temperature source Oral, resp. rate 18, height 5\' 7"  (1.702 m), weight 107.8 kg, last menstrual period 06/30/2022, SpO2 98%. Physical Exam Physical Examination: General appearance - alert, well appearing, and in no distress Heart - normal rate and regular rhythm Abdomen - soft, nontender, nondistended, no masses or organomegaly Breasts - breasts appear normal, no suspicious masses, no skin or nipple changes  or axillary nodes Pelvic - VULVA: normal appearing vulva with no masses, tenderness or lesions, VAGINA: normal appearing vagina with normal color and discharge, no lesions, CERVIX: normal appearing cervix without discharge or lesions, ADNEXA: normal adnexa in size, nontender and no masses, RECTAL: normal rectal, no masses Rectal - normal rectal, no masses Extremities - peripheral pulses normal, no pedal edema, no clubbing or cyanosis   Results for orders placed or performed during the hospital encounter of 08/04/22 (from  the past 24 hour(s))  Pregnancy, urine POC     Status: None   Collection Time: 08/04/22  7:39 AM  Result Value Ref Range   Preg Test, Ur NEGATIVE NEGATIVE  CBC     Status: None   Collection Time: 08/04/22  7:53 AM  Result Value Ref Range   WBC 6.9 4.0 - 10.5 K/uL   RBC 4.92 3.87 - 5.11 MIL/uL   Hemoglobin 14.2 12.0 - 15.0 g/dL   HCT 40.9 81.1 - 91.4 %   MCV 91.3 80.0 - 100.0 fL   MCH 28.9 26.0 - 34.0 pg   MCHC 31.6 30.0 - 36.0 g/dL   RDW 78.2 95.6 - 21.3 %   Platelets 246 150 - 400 K/uL   nRBC 0.0 0.0 - 0.2 %  Glucose, capillary     Status: Abnormal   Collection Time: 08/04/22  7:59 AM  Result Value Ref Range   Glucose-Capillary 127 (H) 70 - 99 mg/dL    No results found.  Assessment/Plan: US demonstrated 17 week size uterus with remaining piece of IUD EUA hysteroscopy.  Removal ofIUD and D&C Pt understands the risks are but not limited to bleeding,infection, perforation of the uterus.    Ellarose Brandi A Loann Chahal 08/04/2022, 9:04 AM

## 2022-08-05 ENCOUNTER — Encounter (HOSPITAL_BASED_OUTPATIENT_CLINIC_OR_DEPARTMENT_OTHER): Payer: Self-pay | Admitting: Obstetrics and Gynecology

## 2022-08-05 LAB — SURGICAL PATHOLOGY

## 2022-08-06 ENCOUNTER — Other Ambulatory Visit: Payer: Self-pay | Admitting: Obstetrics and Gynecology

## 2022-08-07 ENCOUNTER — Other Ambulatory Visit: Payer: Self-pay | Admitting: Obstetrics and Gynecology

## 2022-08-07 DIAGNOSIS — D259 Leiomyoma of uterus, unspecified: Secondary | ICD-10-CM

## 2022-08-18 ENCOUNTER — Inpatient Hospital Stay: Admission: RE | Admit: 2022-08-18 | Payer: Medicaid Other | Source: Ambulatory Visit

## 2022-08-18 ENCOUNTER — Other Ambulatory Visit: Payer: Medicaid Other

## 2022-08-26 ENCOUNTER — Other Ambulatory Visit: Payer: Medicaid Other

## 2022-09-26 ENCOUNTER — Other Ambulatory Visit: Payer: Self-pay | Admitting: Interventional Radiology

## 2022-09-26 DIAGNOSIS — D259 Leiomyoma of uterus, unspecified: Secondary | ICD-10-CM

## 2022-10-08 ENCOUNTER — Inpatient Hospital Stay: Admission: RE | Admit: 2022-10-08 | Payer: Medicaid Other | Source: Ambulatory Visit

## 2022-10-08 ENCOUNTER — Ambulatory Visit
Admission: RE | Admit: 2022-10-08 | Discharge: 2022-10-08 | Disposition: A | Discharge status: Home / Self Care | Payer: Medicaid Other | Source: Ambulatory Visit | Attending: Interventional Radiology | Admitting: Interventional Radiology

## 2022-10-08 ENCOUNTER — Other Ambulatory Visit: Payer: Medicaid Other

## 2022-10-08 DIAGNOSIS — D259 Leiomyoma of uterus, unspecified: Secondary | ICD-10-CM

## 2022-10-08 MED ORDER — GADOPICLENOL 0.5 MMOL/ML IV SOLN
9.0000 mL | Freq: Once | INTRAVENOUS | Status: AC | PRN
  Administered 2022-10-08: 9 mL via INTRAVENOUS

## 2022-10-14 ENCOUNTER — Other Ambulatory Visit: Payer: Self-pay | Admitting: Interventional Radiology

## 2022-10-14 ENCOUNTER — Ambulatory Visit
Admission: RE | Admit: 2022-10-14 | Discharge: 2022-10-14 | Disposition: A | Discharge status: Home / Self Care | Payer: Medicaid Other | Source: Ambulatory Visit | Attending: Interventional Radiology | Admitting: Interventional Radiology

## 2022-10-14 ENCOUNTER — Ambulatory Visit
Admission: RE | Admit: 2022-10-14 | Discharge: 2022-10-14 | Disposition: A | Discharge status: Home / Self Care | Payer: Medicaid Other | Source: Ambulatory Visit | Attending: Interventional Radiology

## 2022-10-14 DIAGNOSIS — D259 Leiomyoma of uterus, unspecified: Secondary | ICD-10-CM

## 2022-10-14 DIAGNOSIS — R252 Cramp and spasm: Secondary | ICD-10-CM

## 2022-10-14 HISTORY — PX: IR RADIOLOGIST EVAL & MGMT: IMG5224

## 2022-10-14 NOTE — Consult Note (Signed)
Chief Complaint: Patient was seen in consultation today for uterine fibroids at the request of El-Abd,Yasser J  Referring Physician(s): El-Abd,Yasser J  History of Present Illness: Rebecca Dougherty is a 52 y.o. female (G1 P1) who presents to discuss possible treatment options for uterine fibroids.  She is currently 52 years old and perimenopausal.  She has been suffering with hot flashes, muscle cramps and pain, insomnia, weight gain and mood swings.  She is currently on an estradiol patch for hot flashes and is also taking progesterone supplementation.  She does occasionally have intermittent severe cramping and shooting pains in her pelvic region.  However, she has not had a menstrual cycle since June and only occasionally spots.  She has not experiencing heavy bleeding.  She has no bulk related symptoms, dysuria or other symptoms.  In fact, she did not know she had a fibroid until she underwent attempted retrieval of an IUD at which time she was noted to have an enlarged uterus and a large uterine fibroid.  Recent MRI imaging demonstrates a large fundal intramural fibroid with areas of internal cystic and hemorrhagic degeneration.  Past Medical History:  Diagnosis Date   Ambulates with cane    Anemia    years ago   Anxiety    Asthma    allergy induced   Cluster headaches    Complication of anesthesia    pt has hypotension during anesthesia, woke during the procedure but couldn't open eyes and couldn;t move   Depression    History of Laryngospasms    opens right up when relaxes self  last time occurred  2022 per pt   Hypotension    Migraines    migraines   Pneumonia    walking pneumonia yrs ago   Sindy Messing due to migraines  November, 2019, told by new neurologist 2023 no tias    Past Surgical History:  Procedure Laterality Date   BREAST REDUCTION SURGERY     HYSTEROSCOPY     yrs ago   HYSTEROSCOPY N/A 08/04/2022   Procedure: HYSTEROSCOPY;  Surgeon: Jaymes Graff, MD;  Location: [facility];  Service: Gynecology;  Laterality: N/A;  removal of impacted foreign body.   IR RADIOLOGIST EVAL & MGMT  10/14/2022   IUD REMOVAL N/A 08/04/2022   Procedure: INTRAUTERINE DEVICE (IUD) REMOVAL removal of PARTIAL IUD;  Surgeon: Jaymes Graff, MD;  Location: [facility];  Service: Gynecology;  Laterality: N/A;   KNEE ARTHROSCOPY Right 10/22/2016   Procedure: Right Knee Arthroscopy and Debridement;  Surgeon: Nadara Mustard, MD;  Location: [facility] OR;  Service: Orthopedics;  Laterality: Right;   KNEE ARTHROSCOPY Left    KNEE ARTHROSCOPY Right    KNEE ARTHROSCOPY Right    MENISCUS REPAIR Right    MENISCUS REPAIR Left    ORIF ANKLE FRACTURE Right 01/08/2018   Procedure: OPEN REDUCTION INTERNAL FIXATION (ORIF) RIGHT ANKLE FRACTURE;  Surgeon: Nadara Mustard, MD;  Location: [facility] OR;  Service: Orthopedics;  Laterality: Right;   right hand surgery  2023    Allergies: Apple juice, Percocet [oxycodone-acetaminophen], Zinc, and Morphine and codeine  Medications: Prior to Admission medications   Medication Sig Start Date End Date Taking? Authorizing Provider  albuterol (VENTOLIN HFA) 108 (90 Base) MCG/ACT inhaler Inhale 2 puffs into the lungs every 6 (six) hours as needed for wheezing or shortness of breath (or seasonal allergies).   Yes [provider]  APPLE CIDER VINEGAR PO Take by mouth daily.  Yes [provider]  b complex vitamins capsule Take 1 capsule by mouth daily.   Yes [provider]  buPROPion (WELLBUTRIN XL) 150 MG 24 hr tablet Take 150 mg by mouth daily. 08/12/16  Yes [provider]  cholecalciferol (VITAMIN D3) 25 MCG (1000 UT) tablet Take 5,000 Units by mouth daily. With vitamin k   Yes [provider]  Creatinine POWD by Does not apply route. Creatiine 200 mg 2 dabs qd   Yes [provider]  fluticasone (FLONASE) 50 MCG/ACT nasal spray Place 1 spray into both nostrils daily  as needed for allergies.    Yes [provider]  MAGNESIUM PO Take by mouth. Magnesium glycolate 400 mg 2 tabs daily   Yes [provider]  MAGNESIUM PO Take by mouth. Magnesium cerenote 500 mg q day   Yes [provider]  meloxicam (MOBIC) 15 MG tablet Take 1 tablet (15 mg total) by mouth daily. 03/30/18  Yes Nadara Mustard, MD  metformin (FORTAMET) 500 MG (OSM) 24 hr tablet Take 500 mg by mouth daily with breakfast.   Yes [provider]  NON FORMULARY Take 1 tablet by mouth See admin instructions. Garden of Life Multivitamin for Women: Take 1 tablet by mouth once a day   Yes [provider]  Probiotic Product (PROBIOTIC PO) Take 1 tablet by mouth daily.   Yes [provider]  Turmeric (QC TUMERIC COMPLEX PO) Take 1,500 mg by mouth daily.   Yes [provider]  UNABLE TO FIND Lions mame 2 tabs daily   Yes [provider]  vitamin E 1000 UNIT capsule Take 1,000 Units by mouth daily.   Yes [provider]  zinc gluconate 50 MG tablet Take 50 mg by mouth daily.   Yes [provider]  diphenhydrAMINE (BENADRYL) 50 MG capsule Take 50 mg by mouth every 6 (six) hours as needed.    [provider]  HYDROcodone-acetaminophen (NORCO/VICODIN) 5-325 MG tablet Take 1 tablet by mouth every 6 (six) hours as needed for moderate pain. Patient not taking: Reported on 08/01/2022 02/08/18   Rayburn, Fanny Bien, PA-C  ibuprofen (MOTRIN IB) 200 MG tablet Take 3 tablets (600 mg total) by mouth every 6 (six) hours as needed for mild pain (do not take with meoloxicam). Patient not taking: Reported on 10/14/2022 08/04/22   Jaymes Graff, MD  ketotifen (ZADITOR) 0.025 % ophthalmic solution Place 1 drop into both eyes daily as needed (allergies).     [provider]  Melatonin 10 MG TABS Take by mouth at bedtime. Patient not taking: Reported on 10/14/2022    [provider]  NON FORMULARY Place 30 mg under  the tongue See admin instructions. CBD oil: Place 30 mg of oil under the tongue 1-2 times a day as needed for pain Patient not taking: Reported on 10/14/2022    [provider]  promethazine (PHENERGAN) 12.5 MG tablet Take 1 tablet (12.5 mg total) by mouth every 6 (six) hours as needed for nausea or vomiting. Patient not taking: Reported on 10/14/2022 01/18/18   Rayburn, Fanny Bien, PA-C     Family History  Problem Relation Age of Onset   Alzheimer's disease Mother    Anxiety disorder Mother     Social History   Socioeconomic History   Marital status: Single    Spouse name: Not on file   Number of children: Not on file   Years of education: Not on file   Highest education  level: Not on file  Occupational History   Not on file  Tobacco Use   Smoking status: Never   Smokeless tobacco: Never  Vaping Use   Vaping status: Never Used  Substance and Sexual Activity   Alcohol use: No   Drug use: Never   Sexual activity: Not on file  Other Topics Concern   Not on file  Social History Narrative   Not on file   Social Determinants of Health   Financial Resource Strain: Patient Declined (08/11/2021)   [facility] to 03[facility]22[facility] prior to 03/22/2022.   Overall Financial Resource Strain (CARDIA)    Difficulty of Paying Living Expenses: Patient declined  Food Insecurity: Patient Declined (08/11/2021)   [facility]   Hunger Vital Sign    Worried About Running Out of Food in the Last Year: Patient declined    Ran Out of Food in the Last Year: Patient declined  Transportation Needs: Patient Declined (08/11/2021)   [facility] prior to 03/22/2022[facility] visits prior to 03/22/2022.   PRAPARE - Administrator, Civil Service  (Medical): Patient declined    Lack of Transportation (Non-Medical): Patient declined  Physical Activity: Insufficiently Active (08/11/2021)   [facility] prior to 03/22/2022[facility] visits prior to 03/22/2022.   Exercise Vital Sign    Days of Exercise per Week: 3 days    Minutes of Exercise per Session: 10 min  Stress: Stress Concern Present (08/11/2021)   [facility] prior to 03/22/2022., [facility] visits prior to 03/22/2022.   [employer] of Occupational Health - Occupational Stress Questionnaire    Feeling of Stress : Very much  Social Connections: Unknown (08/11/2021)   [facility]   Social Connection and Isolation Panel [NHANES]    Frequency of Communication with Friends and Family: More than three times a week    Frequency of Social Gatherings with Friends and Family: Once a week    Attends Religious Services: Patient declined    Database administrator or Organizations: No    Attends Banker Meetings: Never    Marital Status: Patient declined     Review of Systems: A 12 point ROS discussed and pertinent positives are indicated in the HPI above.  All other systems are negative.  Review of Systems  Vital Signs: There were no vitals taken for this visit.    Physical Exam Constitutional:      General: She is not in acute distress.    Appearance: Normal appearance. She is obese.  HENT:     Head: Normocephalic and atraumatic.  Eyes:     General: No scleral icterus. Cardiovascular:     Rate and Rhythm: Normal rate.  Pulmonary:     Effort: Pulmonary effort is normal.  Abdominal:     General: There is no distension.     Tenderness: There is no abdominal tenderness.  Skin:    General: Skin is warm and dry.  Neurological:     Mental  Status: She is alert and oriented to person, place, and time.  Psychiatric:        Mood and  Affect: Mood normal.        Behavior: Behavior normal.       Imaging: IR Radiologist Eval & Mgmt  Result Date: 10/14/2022 EXAM: NEW PATIENT OFFICE VISIT CHIEF COMPLAINT: SEE EPIC NOTE HISTORY OF PRESENT ILLNESS: SEE EPIC NOTE REVIEW OF SYSTEMS: SEE EPIC NOTE PHYSICAL EXAMINATION: SEE EPIC NOTE ASSESSMENT AND PLAN: SEE EPIC NOTE Electronically Signed   By: Malachy Moan M.D.   On: 10/14/2022 12:37   MR PELVIS W WO CONTRAST  Result Date: 10/14/2022 CLINICAL DATA:  Uterine mass EXAM: MRI PELVIS WITHOUT AND WITH CONTRAST TECHNIQUE: Multiplanar multisequence MR imaging of the pelvis was performed both before and after administration of intravenous contrast. CONTRAST:  9 cc view I COMPARISON:  Pelvic ultrasound 07/30/2022 FINDINGS: Uterus: Uterine size 20.0 by 8.6 by 11.1 cm (volume = 1000 cm^3). In the fundus of the uterus, and distal to the endometrium but not definitely attached to the endometrium, we demonstrate a 11.6 by 8.1 by 10.7 cm (volume = 530 cm^3) mass with mostly internal enhancing components but with areas of internal cystic degeneration and some additional hypoenhancing components. Nonenhancing hemorrhagic component posteriorly measures 5.9 by 2.2 by 5.7 cm (volume = 39 cm^3). Overall appearance compatible with a large fundal intramural fibroid which is mostly enhancing although with areas of cystic and hemorrhagic degeneration internally. No other significant uterine lesions observed. Endometrial stripe 5 mm in thickness. Cervix unremarkable. Ovaries: Right ovary measures 2.5 by 1.9 by 2.5 cm and appears unremarkable. Left ovary measures 2.4 by 1.1 by 2.0 cm likewise appears unremarkable. Musculoskeletal: Mild grade 1 degenerative anterolisthesis at L4-5. Other: No supplemental non-categorized findings. IMPRESSION: 1. Large fundal intramural fibroid is mostly enhancing but has with areas of  internal cystic and hemorrhagic degeneration. 2. Mild grade 1 degenerative anterolisthesis at L4-5. Electronically Signed   By: Gaylyn Rong M.D.   On: 10/14/2022 10:18    Labs:  CBC: Recent Labs    08/04/22 0753  WBC 6.9  HGB 14.2  HCT 44.9  PLT 246    COAGS: No results for input(s): "INR", "APTT" in the last 8760 hours.  BMP: No results for input(s): "NA", "K", "CL", "CO2", "GLUCOSE", "BUN", "CALCIUM", "CREATININE", "GFRNONAA", "GFRAA" in the last 8760 hours.  Invalid input(s): "CMP"  LIVER FUNCTION TESTS: No results for input(s): "BILITOT", "AST", "ALT", "ALKPHOS", "PROT", "ALBUMIN" in the last 8760 hours.  TUMOR MARKERS: No results for input(s): "AFPTM", "CEA", "CA199", "CHROMGRNA" in the last 8760 hours.  Assessment and Plan:  Extremely pleasant 51 year old perimenopausal female with an incidentally identified fundal uterine fibroid.  We discussed the natural history and treatment options for uterine fibroids.  She understands that her fibroid is likely at the end of life and should cause her no more trouble when she has fully entered menopause.  We discussed that pursuing uterine artery embolization is a consideration and may relieve some of her cramping symptoms sooner but is certainly optional and not a required intervention.  She understands and would like to avoid any invasive procedures at this time.  Additionally, she has right calf pain and has a family history of thromboembolic disease.  I will order a DVT ultrasound to exclude DVT.  1.) Right leg duplex venous US for calf pain, possible DVT  Thank you for this interesting consult.  I greatly enjoyed meeting Romanita Siever and look forward to participating in their care.  A copy of this report was sent to the requesting provider on this date.  Electronically Signed: Kandis Cocking  Angelicia Lessner 10/14/2022, 12:46 PM   I spent a total of 40 Minutes  in face to face in clinical consultation, greater than 50% of  which was counseling/coordinating care for uterine fibroids

## 2023-04-27 ENCOUNTER — Ambulatory Visit (HOSPITAL_COMMUNITY): Admit: 2023-04-27 | Payer: Medicaid Other | Admitting: Orthopedic Surgery

## 2023-04-27 SURGERY — TOTAL KNEE ARTHROPLASTY
Anesthesia: Choice | Site: Knee | Laterality: Right

## 2023-05-14 ENCOUNTER — Other Ambulatory Visit: Payer: Self-pay | Admitting: Obstetrics and Gynecology

## 2023-05-14 DIAGNOSIS — R5381 Other malaise: Secondary | ICD-10-CM

## 2023-05-28 ENCOUNTER — Other Ambulatory Visit: Payer: Self-pay | Admitting: Obstetrics and Gynecology

## 2023-05-28 DIAGNOSIS — Z13820 Encounter for screening for osteoporosis: Secondary | ICD-10-CM

## 2023-06-05 DIAGNOSIS — G8918 Other acute postprocedural pain: Secondary | ICD-10-CM

## 2023-06-08 ENCOUNTER — Other Ambulatory Visit: Payer: Self-pay

## 2023-06-08 DIAGNOSIS — G8918 Other acute postprocedural pain: Secondary | ICD-10-CM

## 2023-06-09 ENCOUNTER — Other Ambulatory Visit

## 2023-06-24 ENCOUNTER — Other Ambulatory Visit

## 2023-07-15 NOTE — H&P (Signed)
 TOTAL KNEE ADMISSION H&P  Patient is being admitted for right total knee arthroplasty.  Subjective:  Chief Complaint: Right knee pain.  HPI: Rebecca Dougherty, 53 y.o. female has a history of pain and functional disability in the right knee due to arthritis and has failed non-surgical conservative treatments for greater than 12 weeks to include corticosteriod injections, viscosupplementation injections, and activity modification. Onset of symptoms was gradual, starting 15 years ago with gradually worsening course since that time. The patient noted prior procedures on the knee to include  arthroscopy and menisectomy on the right knee.  Patient currently rates pain in the right knee at 7 out of 10 with activity. Patient has worsening of pain with activity and weight bearing and pain that interferes with activities of daily living. Patient has evidence of periarticular osteophytes and joint space narrowing by imaging studies. There is no active infection.  Patient Active Problem List   Diagnosis Date Noted   Closed fracture of right ankle    History of meniscal tear    Arthritis of knee, right     Past Medical History:  Diagnosis Date   Ambulates with cane    Anemia    years ago   Anxiety    Asthma    allergy induced   Cluster headaches    Complication of anesthesia    pt has hypotension during anesthesia, woke during the procedure but couldn't open eyes and couldn;t move   Depression    History of Laryngospasms    opens right up when relaxes self  last time occurred  2022 per pt   Hypotension    Migraines    migraines   Pneumonia    walking pneumonia yrs ago   olen Petri due to migraines  November, 2019, told by new neurologist 2023 no tias    Past Surgical History:  Procedure Laterality Date   BREAST REDUCTION SURGERY     HYSTEROSCOPY     yrs ago   HYSTEROSCOPY N/A 08/04/2022   Procedure: HYSTEROSCOPY;  Surgeon: Armond Cape, MD;  Location: [facility];   Service: Gynecology;  Laterality: N/A;  removal of impacted foreign body.   IR RADIOLOGIST EVAL & MGMT  10/14/2022   IUD REMOVAL N/A 08/04/2022   Procedure: INTRAUTERINE DEVICE (IUD) REMOVAL removal of PARTIAL IUD;  Surgeon: Armond Cape, MD;  Location: [facility];  Service: Gynecology;  Laterality: N/A;   KNEE ARTHROSCOPY Right 10/22/2016   Procedure: Right Knee Arthroscopy and Debridement;  Surgeon: Harden Jerona GAILS, MD;  Location: [facility] OR;  Service: Orthopedics;  Laterality: Right;   KNEE ARTHROSCOPY Left    KNEE ARTHROSCOPY Right    KNEE ARTHROSCOPY Right    MENISCUS REPAIR Right    MENISCUS REPAIR Left    ORIF ANKLE FRACTURE Right 01/08/2018   Procedure: OPEN REDUCTION INTERNAL FIXATION (ORIF) RIGHT ANKLE FRACTURE;  Surgeon: Harden Jerona GAILS, MD;  Location: [facility] OR;  Service: Orthopedics;  Laterality: Right;   right hand surgery  2023    Prior to Admission medications   Medication Sig Start Date End Date Taking? Authorizing Provider  albuterol (VENTOLIN HFA) 108 (90 Base) MCG/ACT inhaler Inhale 2 puffs into the lungs every 6 (six) hours as needed for wheezing or shortness of breath (or seasonal allergies).    [provider]  APPLE CIDER VINEGAR PO Take by mouth daily.    [provider]  b complex vitamins capsule Take 1 capsule by mouth daily.  [provider]  buPROPion (WELLBUTRIN XL) 150 MG 24 hr tablet Take 150 mg by mouth daily. 08/12/16   [provider]  cholecalciferol (VITAMIN D3) 25 MCG (1000 UT) tablet Take 5,000 Units by mouth daily. With vitamin k    [provider]  Creatinine POWD by Does not apply route. Creatiine 200 mg 2 dabs qd    [provider]  diphenhydrAMINE (BENADRYL) 50 MG capsule Take 50 mg by mouth every 6 (six) hours as needed.    [provider]  fluticasone (FLONASE) 50 MCG/ACT nasal spray Place 1 spray into both nostrils daily as needed for allergies.     [provider]   HYDROcodone -acetaminophen  (NORCO/VICODIN) 5-325 MG tablet Take 1 tablet by mouth every 6 (six) hours as needed for moderate pain. Patient not taking: Reported on 08/01/2022 02/08/18   Rayburn, Elouise Phlegm, PA-C  ibuprofen  (MOTRIN  IB) 200 MG tablet Take 3 tablets (600 mg total) by mouth every 6 (six) hours as needed for mild pain (do not take with meoloxicam). Patient not taking: Reported on 10/14/2022 08/04/22   Dillard, Naima, MD  ketotifen (ZADITOR) 0.025 % ophthalmic solution Place 1 drop into both eyes daily as needed (allergies).     [provider]  MAGNESIUM PO Take by mouth. Magnesium glycolate 400 mg 2 tabs daily    [provider]  MAGNESIUM PO Take by mouth. Magnesium cerenote 500 mg q day    [provider]  Melatonin 10 MG TABS Take by mouth at bedtime. Patient not taking: Reported on 10/14/2022    [provider]  meloxicam  (MOBIC ) 15 MG tablet Take 1 tablet (15 mg total) by mouth daily. 03/30/18   Harden Jerona GAILS, MD  metformin (FORTAMET) 500 MG (OSM) 24 hr tablet Take 500 mg by mouth daily with breakfast.    [provider]  NON FORMULARY Place 30 mg under the tongue See admin instructions. CBD oil: Place 30 mg of oil under the tongue 1-2 times a day as needed for pain Patient not taking: Reported on 10/14/2022    [provider]  NON FORMULARY Take 1 tablet by mouth See admin instructions. Garden of Life Multivitamin for Women: Take 1 tablet by mouth once a day    [provider]  Probiotic Product (PROBIOTIC PO) Take 1 tablet by mouth daily.    [provider]  promethazine  (PHENERGAN ) 12.5 MG tablet Take 1 tablet (12.5 mg total) by mouth every 6 (six) hours as needed for nausea or vomiting. Patient not taking: Reported on 10/14/2022 01/18/18   Rayburn, Elouise Phlegm, PA-C  Turmeric (QC TUMERIC COMPLEX PO) Take 1,500 mg by mouth daily.    [provider]  UNABLE TO FIND Lions mame 2 tabs daily     [provider]  vitamin E 1000 UNIT capsule Take 1,000 Units by mouth daily.    [provider]  zinc gluconate 50 MG tablet Take 50 mg by mouth daily.    [provider]    Allergies  Allergen Reactions   Apple Juice Anaphylaxis    Peel from apple   Percocet [Oxycodone -Acetaminophen ] Nausea And Vomiting, Nausea Only and Other (See Comments)    EXTREME NAUSEA to the point of throwing up (left patient with a hangover, also)   Zinc    Morphine And Codeine Other (See Comments)    HYPOTHERMIA and does not help with pain    Social History   Socioeconomic History   Marital status: Single  Spouse name: Not on file   Number of children: Not on file   Years of education: Not on file   Highest education level: Not on file  Occupational History   Not on file  Tobacco Use   Smoking status: Never   Smokeless tobacco: Never  Vaping Use   Vaping status: Never Used  Substance and Sexual Activity   Alcohol use: No   Drug use: Never   Sexual activity: Not on file  Other Topics Concern   Not on file  Social History Narrative   Not on file   Social Drivers of Health   Financial Resource Strain: Patient Declined (08/11/2021)   Received from [facility] prior to 03/22/2022[facility]   Overall Financial Resource Strain (CARDIA)    Difficulty of Paying Living Expenses: Patient declined  Food Insecurity: Patient Declined (08/11/2021)   Received from [facility]   Hunger Vital Sign    Worried About Running Out of Food in the Last Year: Patient declined    Ran Out of Food in the Last Year: Patient declined  Transportation Needs: Patient Declined (08/11/2021)   [facility] prior to 03/22/2022., [facility]   PRAPARE - Transportation    Lack of Transportation (Medical): Patient declined    Lack of Transportation (Non-Medical): Patient declined  Physical Activity: Insufficiently Active  (08/11/2021)   Received from [facility] visits prior to 03/22/2022., [facility]   Exercise Vital Sign    On average, how many days per week do you engage in moderate to strenuous exercise (like a brisk walk)?: 3 days    On average, how many minutes do you engage in exercise at this level?: 10 min  Stress: Stress Concern Present (08/11/2021)   [facility] prior to 03/22/2022., [facility]   [employer] of Occupational Health - Occupational Stress Questionnaire    Feeling of Stress : Very much  Social Connections: Unknown (08/11/2021)   Received from [facility]   Social Connection and Isolation Panel    In a typical week, how many times do you talk on the phone with family, friends, or neighbors?: More than three times a week    How often do you get together with friends or relatives?: Once a week    How often do you attend church or religious services?: Patient declined    Do you belong to any clubs or organizations such as church groups, unions, fraternal or athletic groups, or school groups?: No    How often do you attend meetings of the clubs or organizations you belong to?: Never    Are you married, widowed, divorced, separated, never married, or living with a partner?: Patient declined  Intimate Partner Violence: Not on file    Tobacco Use: Low Risk  (05/25/2023)   Received from [facility]   Patient History    Smoking Tobacco Use: Never    Smokeless Tobacco Use: Never    Passive Exposure: Not on file   Social History   Substance and Sexual Activity  Alcohol Use No    Family History  Problem Relation Age of Onset   Alzheimer's disease Mother    Anxiety disorder Mother     ROS  Objective:  Physical Exam: - Well-developed female alert and oriented in no apparent distress   - Evaluation of her hips shows normal range of motion, no discomfort.   -  Right knee shows no effusion.   - Range of motion  right knee is about 10-120 degrees with moderate crepitus on range of motion.   - Tender medially.   - No lateral tenderness or knee instability.   - Left knee: no effusion, range 0-125 degrees, crepitus on range of motion, tender medial greater than lateral, no instability.   - Gait pattern is antalgic on the right.    IMAGING:   Radiographs taken today of both knees in AP views demonstrate bone-on-bone arthritis in the medial compartments of both knees. The lateral view did not come out, so only the AP views were available. Previous lateral view demonstrated bone-on-bone patellofemoral arthritis.  Assessment/Plan:  End stage arthritis, right knee   The patient history, physical examination, clinical judgment of the provider and imaging studies are consistent with end stage degenerative joint disease of the right knee and total knee arthroplasty is deemed medically necessary. The treatment options including medical management, injection therapy arthroscopy and arthroplasty were discussed at length. The risks and benefits of total knee arthroplasty were presented and reviewed. The risks due to aseptic loosening, infection, stiffness, patella tracking problems, thromboembolic complications and other imponderables were discussed. The patient acknowledged the explanation, agreed to proceed with the plan and consent was signed. Patient is being admitted for inpatient treatment for surgery, pain control, PT, OT, prophylactic antibiotics, VTE prophylaxis, progressive ambulation and ADLs and discharge planning. The patient is planning to be discharged home.   Patient's anticipated LOS is less than 2 midnights, meeting these requirements: - Younger than 44 - Lives within 1 hour of care - Has a competent adult at home to recover with post-op recover - NO history of  - Chronic pain requiring opiods  - Diabetes  - Coronary Artery Disease  - Heart failure  - Heart attack  - Stroke  - DVT/VTE  -  Cardiac arrhythmia  - Respiratory Failure/COPD  - Renal failure  - Anemia  - Advanced Liver disease  Therapy Plans: EO Disposition: Home with partner, John Planned DVT Prophylaxis: Aspirin 81mg  BID  DME Needed: None PCP: Meghan Hancock, PA-C (appt on 7/9*) TXA: IV Allergies: adhesive, Aimovig (HA), Emgality (HA), hydrocodone  (nausea), zofran  (nausea, palpitations), shellfish (itching) Anesthesia Concerns: Anesthesia awareness BMI: 37.1 Last HgbA1c: Not diabetic Pharmacy: [facility] on [address] in [place]  Other: -SDD -Wants to schedule L TKA before the end of 2025 -Preparing to start Zepbound, will stop this 1 week prior to surgery -No aquacel -Nickel testing pending** -States that morphine has been ineffective in the past, tramadol  and oxycodone  have worked well  - Patient was instructed on what medications to stop prior to surgery. - Follow-up visit in 2 weeks with Dr. Melodi - Begin physical therapy following surgery - Pre-operative lab work as pre-surgical testing - Prescriptions will be provided in hospital at time of discharge  Rebecca Sender, PA-C Orthopedic Surgery [facility] [place]

## 2023-07-21 ENCOUNTER — Encounter: Payer: Self-pay | Admitting: Allergy & Immunology

## 2023-07-21 ENCOUNTER — Other Ambulatory Visit: Payer: Self-pay

## 2023-07-21 ENCOUNTER — Ambulatory Visit (INDEPENDENT_AMBULATORY_CARE_PROVIDER_SITE_OTHER): Payer: PRIVATE HEALTH INSURANCE | Admitting: Allergy & Immunology

## 2023-07-21 VITALS — BP 130/80 | HR 82 | Temp 98.5°F | Resp 18 | Ht 66.14 in | Wt 235.8 lb

## 2023-07-21 DIAGNOSIS — G44021 Chronic cluster headache, intractable: Secondary | ICD-10-CM | POA: Diagnosis not present

## 2023-07-21 DIAGNOSIS — Z9109 Other allergy status, other than to drugs and biological substances: Secondary | ICD-10-CM

## 2023-07-21 DIAGNOSIS — J310 Chronic rhinitis: Secondary | ICD-10-CM

## 2023-07-21 DIAGNOSIS — L231 Allergic contact dermatitis due to adhesives: Secondary | ICD-10-CM | POA: Diagnosis not present

## 2023-07-21 NOTE — Patient Instructions (Addendum)
 1. 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 - This looks for sensitivities to chemicals, dyes, cosmetics, and metals. - These are placed on a Monday and then kept in place for 48 hours.  - We read the reactions on a Wednesday and then again on a Friday to look for allergies to these items. - This will help Dr. Hiram figure out the best type of joint to use.   2. Chronic rhinitis - We will schedule you for allergy testing (this is the traditional prick testing on the back). - Continue with the cetirizine 10mg  daily as needed. - Stop this medication and other antihistamines for THREE DAYS before the next appointment.   3. Allergy-induced asthma  - This seems to be very episodic and in distinct environments.  - We can be more aggressive in the future if needed with a daily controller medication. - Continue with albuterol 2 puffs every 4-6 hours as needed.  4. Return in about 6 days (around 07/27/2023) for [facility] TESTING (NAC-80). You can have the follow up appointment with Dr. Iva or a Nurse Practicioner (our Nurse Practitioners are excellent and always have Physician oversight!).    Please inform us  of any Emergency Department visits, hospitalizations, or changes in symptoms. Call us  before going to the ED for breathing or allergy symptoms since we might be able to fit you in for a sick visit. Feel free to contact us  anytime with any questions, problems, or concerns.  It was a pleasure to meet you today!  Websites that have reliable patient information: 1. American Academy of Asthma, Allergy, and Immunology: www.aaaai.org 2. Food Allergy Research and Education (FARE): foodallergy.org 3. Mothers of Asthmatics: http://www.asthmacommunitynetwork.org 4. American College of Allergy, Asthma, and Immunology: www.acaai.org      "Like" us  on Facebook and Instagram for our latest updates!      A healthy democracy  works best when [employer] participate! Make sure you are registered to vote! If you have moved or changed any of your contact information, you will need to get this updated before voting! Scan the QR codes below to learn more!

## 2023-07-21 NOTE — Progress Notes (Signed)
 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 - This looks for sensitivities to chemicals, dyes, cosmetics, and metals. - These are placed on a Monday and then kept in place for 48 hours.  - We read the reactions on a Wednesday and then again on a Friday to look for allergies to these items. - This will help Dr. Hiram figure out the best type of joint to use.   2. Chronic rhinitis - We will schedule you for allergy testing (this is the traditional prick testing on the back). - Continue with the cetirizine 10mg  daily as needed. - Stop this medication and other antihistamines for THREE DAYS before the next appointment.   3. Allergy-induced asthma  - This seems to be very episodic and in distinct environments.  - We can be more aggressive in the future if needed with a daily controller medication. - Continue with albuterol 2 puffs every 4-6 hours as needed.  4. Return in about 6 days (around 07/27/2023) for [facility] TESTING (NAC-80). You can have the follow up appointment with Dr. Iva or a Nurse Practicioner (our Nurse Practitioners are excellent and always have Physician oversight!).    This note in its entirety was forwarded to the Provider who requested this consultation.  Subjective:   Rebecca Dougherty is a 53 y.o. female presenting today for evaluation of  Chief Complaint  Patient presents with   Establish Care    Patient presents to the office for our office to see  if she is allergic to the nickel or titanium used for knee replacement. Also adhesive testing. Patient has a reaction to some bandage adhesives.     Rebecca Dougherty has a history of the following: Patient Active Problem List   Diagnosis Date Noted   Closed fracture of right ankle    History of meniscal tear    Arthritis of knee, right     History obtained from: chart review and patient.  Discussed the use of AI scribe software for clinical note transcription with the patient and/or guardian, who gave verbal consent to proceed.  Rebecca Dougherty was referred by Garwin Lum Fuse, PA-C (Inactive).     Rebecca Dougherty is a 53 y.o. female presenting for an evaluation of a metal allergy as well as several other atopic concerns.  PCP: Dr. Gerard Roys Neurologist: Dr. Richardson [facility] Surgeon: Dr. Dempsey Hiram  Endocrinologist: Dr. Lear Faster Gynecologist: Dr. Ovid All  Rheumatologist: PA Lum Garwin  She is scheduled for knee replacement surgery on July 21st and requires allergy testing due to a history of multiple allergies. She experiences significant reactions to adhesives, such as those on Band-Aids, causing bruising and raw skin. Contact dermatitis occurs with certain cosmetics and adhesives, although she has adapted to some, like the film over her estrogen patch.   She has never really had any problems with metals per se.  She mostly has issues with the cosmetics and the adhesives.  She does not really wear jewelry, so she is  not really sure about any costume jewelry or other metal allergies.  She would like to go ahead and get the testing done so that she can feel better knowing that she will do well with the right knee replacement.  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 She takes cetirizine and Flonase as needed and has stopped using Benadryl due  to concerns about dementia. She experiences allergy-induced asthma in environments with many dogs, which she manages with cetirizine and has an albuterol inhaler for emergencies.  Food Allergy Symptom History: She has a history of oral allergy syndrome, previously experiencing throat closure with apples and burning sensations with other fruits, but these symptoms have resolved. She occasionally experiences mild reactions to nuts. She underwent allergy testing two years ago, which was positive for multiple environmental allergens.  Skin Symptom History: She has a history of eczema, which has improved significantly since starting the estrogen patch. No recent ear or sinus infections, although her cluster headaches can mimic sinusitis. She has undergone CT and MRI scans for her headaches, which showed some scar tissue from migraines. She was previously evaluated for autoimmune disease due to a positive ANA test; one doctor told her there was nothing wrong, another suggested an autoimmune disease but could not specify which one, and eventually she was told her symptoms were due to perimenopause.  She experiences joint pain and arthritis in almost every joint, which she associates with menopause. She uses an estrogen patch, which has helped alleviate some symptoms. She takes meloxicam  daily for arthritis pain but prefers natural remedies. She was previously disabled due to cluster headaches, which have improved since starting the estrogen patch. Oxygen therapy is the only effective treatment for her cluster headaches, and she is in the process of obtaining a suitable portable oxygen concentrator.   Otherwise, there is no history of other atopic diseases, including drug allergies, stinging insect allergies, or contact dermatitis. There is no significant infectious history. Vaccinations are up to date.    Past Medical History: Patient Active Problem List   Diagnosis Date Noted   Closed fracture of right ankle     History of meniscal tear    Arthritis of knee, right     Medication List:  Allergies as of 07/21/2023       Reactions   Percocet [oxycodone -acetaminophen ] Nausea And Vomiting, Nausea Only, Other (See Comments)   EXTREME NAUSEA to the point of throwing up (left patient with a hangover, also)   Morphine And Codeine Other (See Comments)   HYPOTHERMIA and does not help with pain        Medication List        Accurate as of July 21, 2023  5:04 PM. If you have any questions, ask your nurse or doctor.          APPLE CIDER VINEGAR PO Take by mouth daily.   b complex vitamins capsule Take 1 capsule by mouth daily.   buPROPion 150 MG 24 hr tablet Commonly known as: WELLBUTRIN XL Take 150 mg by mouth daily.   cholecalciferol 25 MCG (1000 UNIT) tablet Commonly known as: VITAMIN D3 Take 5,000 Units by mouth daily. With vitamin k   Creatinine Powd by Does not apply route. Creatiine 200 mg 2 dabs qd   diphenhydrAMINE 50 MG capsule Commonly known as: BENADRYL Take 50 mg by mouth every 6 (six) hours as needed.   fluticasone  50 MCG/ACT nasal spray Commonly known as: FLONASE Place 1 spray into both nostrils daily as needed for allergies.   HYDROcodone -acetaminophen  5-325 MG tablet Commonly known as: NORCO/VICODIN Take 1 tablet by mouth every 6 (six) hours as needed for moderate pain.   ibuprofen  200 MG tablet Commonly known as: Motrin  IB Take 3 tablets (600 mg total) by mouth every 6 (six) hours as needed for mild pain (do not take with meoloxicam).   ketotifen 0.025 % ophthalmic solution Commonly known as: ZADITOR Place 1 drop into both eyes daily as needed (allergies).   MAGNESIUM PO Take by mouth. Magnesium glycolate 400 mg 2 tabs daily   MAGNESIUM PO Take by mouth. Magnesium cerenote 500 mg q day   Melatonin 10 MG Tabs Take by mouth at bedtime.   meloxicam  15 MG tablet Commonly known as: MOBIC  Take 1 tablet (15 mg total) by mouth daily.   metformin 500  MG (OSM) 24 hr tablet Commonly known as: FORTAMET Take 500 mg by mouth daily with breakfast.   NON FORMULARY Place 30 mg under the tongue See admin instructions. CBD oil: Place 30 mg of oil under the tongue 1-2 times a day as needed for pain   NON FORMULARY Take 1 tablet by mouth See admin instructions. Garden of Life Multivitamin for Women: Take 1 tablet by mouth once a day   PROBIOTIC PO Take 1 tablet by mouth daily.   promethazine  12.5 MG tablet Commonly known as: PHENERGAN  Take 1 tablet (12.5 mg total) by mouth every 6 (six) hours as needed for nausea or vomiting.   QC TUMERIC COMPLEX PO Take 1,500 mg by mouth daily.   UNABLE TO FIND Lions mame 2 tabs daily   Ventolin HFA 108 (90 Base) MCG/ACT inhaler Generic drug: albuterol Inhale 2 puffs into the lungs every 6 (six) hours as needed for wheezing or shortness of breath (or seasonal allergies).   vitamin E 1000 UNIT capsule Take 1,000 Units by mouth daily.   zinc gluconate 50 MG tablet Take 50 mg by mouth daily.        Birth History: non-contributory  Developmental History: non-contributory  Past Surgical History: Past Surgical History:  Procedure Laterality Date   BREAST REDUCTION SURGERY     HYSTEROSCOPY     yrs ago   HYSTEROSCOPY N/A 08/04/2022   Procedure: HYSTEROSCOPY;  Surgeon: Armond Cape, MD;  Location: [facility];  Service: Gynecology;  Laterality: N/A;  removal of impacted foreign body.   IR RADIOLOGIST EVAL & MGMT  10/14/2022   IUD REMOVAL N/A 08/04/2022   Procedure: INTRAUTERINE DEVICE (IUD) REMOVAL removal of PARTIAL IUD;  Surgeon: Armond Cape, MD;  Location: [facility];  Service: Gynecology;  Laterality: N/A;   KNEE ARTHROSCOPY Right 10/22/2016   Procedure: Right Knee Arthroscopy and Debridement;  Surgeon: Harden Jerona GAILS, MD;  Location: [facility] OR;  Service: Orthopedics;  Laterality: Right;   KNEE ARTHROSCOPY Left    KNEE ARTHROSCOPY Right    KNEE ARTHROSCOPY Right     MENISCUS REPAIR Right    MENISCUS REPAIR Left    ORIF ANKLE FRACTURE Right 01/08/2018   Procedure: OPEN REDUCTION INTERNAL FIXATION (ORIF) RIGHT ANKLE FRACTURE;  Surgeon: Harden Jerona GAILS, MD;  Location: [facility] OR;  Service: Orthopedics;  Laterality: Right;   right hand surgery  2023     Family History: Family History  Problem Relation Age of Onset   Alzheimer's disease Mother    Anxiety disorder Mother  Social History: Rebecca Dougherty lives at home with her family.  She lives in a house that is 55 years old.  There is carpeting throughout the home.  She has gas heating and central cooling.  There are 3 dogs inside of the home.  There are birds, chipmunks, deer, raccoons, squirrels, flying squirrels, rabbits, and cat bite of the home.  There are dust mite covers on the bed and the pillows.  There is no tobacco exposure.  There is no fume, chemical, or dust exposure.  There is a HEPA filter in the home.  [place] do not [place] an interstate or industrial area.   Review of systems otherwise negative other than that mentioned in the HPI.    Objective:   Blood pressure 130/80, pulse 82, temperature 98.5 F (36.9 C), temperature source Temporal, resp. rate 18, height 5' 6.14 (1.68 m), weight 235 lb 12.8 oz (107 kg), SpO2 97%. Body mass index is 37.9 kg/m.     Physical Exam Vitals reviewed.  Constitutional:      Appearance: She is well-developed.     Comments: Friendly.  HENT:     Head: Normocephalic and atraumatic.     Right Ear: Tympanic membrane, ear canal and external ear normal. No drainage, swelling or tenderness. Tympanic membrane is not injected, scarred, erythematous, retracted or bulging.     Left Ear: Tympanic membrane, ear canal and external ear normal. No drainage, swelling or tenderness. Tympanic membrane is not injected, scarred, erythematous, retracted or bulging.     Nose: No nasal deformity, septal deviation, mucosal edema or rhinorrhea.     Right Turbinates:  Enlarged and swollen.     Left Turbinates: Enlarged and swollen.     Right Sinus: No maxillary sinus tenderness or frontal sinus tenderness.     Left Sinus: No maxillary sinus tenderness or frontal sinus tenderness.     Comments: No polyps.    Mouth/Throat:     Mouth: Mucous membranes are not pale and not dry.     Pharynx: Uvula midline.     Comments: Minimal cobblestoning.  Eyes:     General: Lids are normal. Allergic shiner present.        Right eye: No discharge.        Left eye: No discharge.     Conjunctiva/sclera: Conjunctivae normal.     Right eye: Right conjunctiva is not injected. No chemosis.    Left eye: Left conjunctiva is not injected. No chemosis.    Pupils: Pupils are equal, round, and reactive to light.    Cardiovascular:     Rate and Rhythm: Normal rate and regular rhythm.     Heart sounds: Normal heart sounds.  Pulmonary:     Effort: Pulmonary effort is normal. No tachypnea, accessory muscle usage or respiratory distress.     Breath sounds: Normal breath sounds. No wheezing, rhonchi or rales.  Chest:     Chest wall: No tenderness.  Abdominal:     Tenderness: There is no abdominal tenderness. There is no guarding or rebound.  Lymphadenopathy:     Head:     Right side of head: No submandibular, tonsillar or occipital adenopathy.     Left side of head: No submandibular, tonsillar or occipital adenopathy.     Cervical: No cervical adenopathy.   Skin:    Coloration: Skin is not pale.     Findings: No abrasion, erythema, petechiae or rash. Rash is not papular, urticarial or vesicular.   Neurological:  Mental Status: She is alert.   Psychiatric:        Behavior: Behavior is cooperative.      Diagnostic studies: none         Marty Shaggy, MD [facility] 

## 2023-07-27 ENCOUNTER — Encounter: Payer: Self-pay | Admitting: Family Medicine

## 2023-07-27 ENCOUNTER — Ambulatory Visit (INDEPENDENT_AMBULATORY_CARE_PROVIDER_SITE_OTHER): Payer: PRIVATE HEALTH INSURANCE | Admitting: Family Medicine

## 2023-07-27 DIAGNOSIS — L235 Allergic contact dermatitis due to other chemical products: Secondary | ICD-10-CM

## 2023-07-27 DIAGNOSIS — L259 Unspecified contact dermatitis, unspecified cause: Secondary | ICD-10-CM | POA: Insufficient documentation

## 2023-07-27 NOTE — Patient Instructions (Addendum)
  Diagnostics: NAC 80 patches placed NAC-80 (1-80)   1. Ammonium persulfate  2. [place] Balsam  3. Omitted  4. 4-tert-Butylphenolformaldehyde resin (PTBP)  5. Bacitracin  6. Budesonide  7. Quaternium-15  8. Cinnamal  9. Cobalt(II) chloride hexahydrate  10. Colophonium yet  11. Methyldibromo glutaronitrile  12. Decyl Glucoside  13. Ethylenediamine dihydrochloride  14. 2-Hydroxyethyl methacrylate  15. Hydroperoxides of Linalool  16. Iodopropynyl butylcarbamate  17. 2-Mercaptobenzothiazole (MBT)  18. Thiuram mix  19. METHYLISOTHIAZOLINONE  20. Propylene glycol  21. 1,3-Diphenylguanidine  22. Hydroperoxides of Limonene  23. Black rubber mix  24. Carba mix  25. Fragrance mix I  26. Fragrance mix II  27. Textile dye mix II  28. Neomycin sulfate  29. Nickel(II) sulfate hexahydrate  30. p-Phenylenediamine (PPD)  31. Potassium dichromate  32. Propolis  33. Sodium Metabisulfite  34. Tixocortol-21-pivalate  35. Lanolin alcohol  36. Methylisothiazolinone + Methylchloroisothiazolinone  37. Cocamidopropyl betaine  38. 3-(Dimethylamino)-1-propylamine  39. Formaldehyde  40. Oleamidopropyl dimethylamine  41. 2-Bromo-2-Nitropropane-l,3-diol  42. Diazolidinyl urea  43. DMDM Hydantoin  44. Epoxy resin, Bisphenol A  45. Benzophenone-4  46. Imidazolidinyl urea  47. Lauryl polyglucose  48 Methyl methacrylate  49. Paraben mix  50. Mercapto mix  51. Caine mix III  52. Mixed dialkyl thiourea  53. Compositae mix II  54. Toluenesulfonamide formaldehyde resin  55. Tea Tree Oil oxidized  56. Ylang-Ylang oil  57. Amidoamine  58. Amerchol L 101  59. Benzocaine  60. Benzyl alchohol  61. Benzyl salicylate  62. Chloroxylenol (PCMX)  63. Cocamide DEA  64. Clobetasol-17-propionate  65. Toluene-2,5-Diamine sulfate  66. Ethyl acrylate  67. N-Isopropyl-N-phenyl--4-phenylenediamine (IPPD)  68. Lidocaine   69. omitted  70. Sesquiterpene lactone mix  71. 2-n-Octyl-4-isothiazolin-3-one  72.  Propyl gallate  73. Polymyxin B sulfate  74. Pramoxine hydrochloride  75. Sodium benzoate  76. Sorbitan oleate  77. Sorbitan sesquioleate  78. Tocopherol  79. BENZALKONIUM CHLORIDE  80. Chlorhexidine  digluconate    Allergic contact dermatitis - Instructions provided on care of the patches for the next 48 hours. - Rebecca Dougherty was instructed to avoid showering for the next 48 hours. - Rebecca Dougherty will follow up in 48 hours and 96 hours for patch readings.   Call the clinic if this treatment plan is not working well for you  Follow up in 2 days or sooner if needed.

## 2023-07-27 NOTE — Patient Instructions (Signed)
 SURGICAL WAITING ROOM VISITATION  Patients having surgery or a procedure may have no more than 2 support people in the waiting area - these visitors may rotate.    Children under the age of 1 must have an adult with them who is not the patient.  Visitors with respiratory illnesses are discouraged from visiting and should remain at home.  If the patient needs to stay at the hospital during part of their recovery, the visitor guidelines for inpatient rooms apply. Pre-op nurse will coordinate an appropriate time for 1 support person to accompany patient in pre-op.  This support person may not rotate.    Please refer to the [facility] website for the visitor guidelines for Inpatients (after your surgery is over and you are in a regular room).    Your procedure is scheduled on: 08/10/23   Report to [facility] Main Entrance    Report to admitting at 5:15 AM   Call this number if you have problems the morning of surgery [phone]   Do not eat food :After Midnight.   After Midnight you may have the following liquids until 4:15 AM DAY OF SURGERY  Water Non-Citrus Juices (without pulp, NO RED-Apple, White grape, White cranberry) Black Coffee (NO MILK/CREAM OR CREAMERS, sugar ok)  Clear Tea (NO MILK/CREAM OR CREAMERS, sugar ok) regular and decaf                             Plain Jell-O (NO RED)                                           Fruit ices (not with fruit pulp, NO RED)                                     Popsicles (NO RED)                                                               Sports drinks like Gatorade (NO RED)     The day of surgery:  Drink ONE (1) Pre-Surgery G2 at 4:15 AM the morning of surgery. Drink in one sitting. Do not sip.  This drink was given to you during your hospital  pre-op appointment visit. Nothing else to drink after completing the  Pre-Surgery G2.          If you have questions, please contact your surgeon's office.   FOLLOW BOWEL PREP  AND ANY ADDITIONAL PRE OP INSTRUCTIONS YOU RECEIVED FROM YOUR SURGEON'S OFFICE!!!     Oral Hygiene is also important to reduce your risk of infection.                                    Remember - BRUSH YOUR TEETH THE MORNING OF SURGERY WITH YOUR REGULAR TOOTHPASTE  DENTURES WILL BE REMOVED PRIOR TO SURGERY PLEASE DO NOT APPLY Poly grip OR ADHESIVES!!!   Stop all vitamins and herbal supplements 7 days before surgery.  Take these medicines the morning of surgery with A SIP OF WATER: Inhaler, Bupropion, Zyrtec, Flonase                               You may not have any metal on your body including hair pins, jewelry, and body piercing             Do not wear make-up, lotions, powders, perfumes, or deodorant  Do not wear nail polish including gel and S&S, artificial/acrylic nails, or any other type of covering on natural nails including finger and toenails. If you have artificial nails, gel coating, etc. that needs to be removed by a nail salon please have this removed prior to surgery or surgery may need to be canceled/ delayed if the surgeon/ anesthesia feels like they are unable to be safely monitored.   Do not shave  48 hours prior to surgery.    Do not bring valuables to the hospital. [facility] IS NOT             RESPONSIBLE   FOR VALUABLES.   Contacts, glasses, dentures or bridgework may not be worn into surgery.  DO NOT BRING YOUR HOME MEDICATIONS TO THE HOSPITAL. PHARMACY WILL DISPENSE MEDICATIONS LISTED ON YOUR MEDICATION LIST TO YOU DURING YOUR ADMISSION IN THE HOSPITAL!    Patients discharged on the day of surgery will not be allowed to drive home.  Someone NEEDS to stay with you for the first 24 hours after anesthesia.              Please read over the following fact sheets you were given: IF YOU HAVE QUESTIONS ABOUT YOUR PRE-OP INSTRUCTIONS PLEASE CALL [phone]   If you received a COVID test during your pre-op visit  it is requested that you wear a mask  when out in public, stay away from anyone that may not be feeling well and notify your surgeon if you develop symptoms. If you test positive for Covid or have been in contact with anyone that has tested positive in the last 10 days please notify you surgeon.      Pre-operative 5 CHG Bath Instructions   You can play a key role in reducing the risk of infection after surgery. Your skin needs to be as free of germs as possible. You can reduce the number of germs on your skin by washing with CHG (chlorhexidine  gluconate) soap before surgery. CHG is an antiseptic soap that kills germs and continues to kill germs even after washing.   DO NOT use if you have an allergy to chlorhexidine /CHG or antibacterial soaps. If your skin becomes reddened or irritated, stop using the CHG and notify one of our RNs at [phone].   Please shower with the CHG soap starting 4 days before surgery using the following schedule:     Please keep in mind the following:  DO NOT shave, including legs and underarms, starting the day of your first shower.   You may shave your face at any point before/day of surgery.  Place clean sheets on your bed the day you start using CHG soap. Use a clean washcloth (not used since being washed) for each shower. DO NOT sleep with pets once you start using the CHG.   CHG Shower Instructions:  If you choose to wash your hair and private area, wash first with your normal shampoo/soap.  After you use shampoo/soap, rinse your hair and  body thoroughly to remove shampoo/soap residue.  Turn the water OFF and apply about 3 tablespoons (45 ml) of CHG soap to a CLEAN washcloth.  Apply CHG soap ONLY FROM YOUR NECK DOWN TO YOUR TOES (washing for 3-5 minutes)  DO NOT use CHG soap on face, private areas, open wounds, or sores.  Pay special attention to the area where your surgery is being performed.  If you are having back surgery, having someone wash your back for you may be helpful. Wait 2  minutes after CHG soap is applied, then you may rinse off the CHG soap.  Pat dry with a clean towel  Put on clean clothes/pajamas   If you choose to wear lotion, please use ONLY the CHG-compatible lotions on the back of this paper.     Additional instructions for the day of surgery: DO NOT APPLY any lotions, deodorants, cologne, or perfumes.   Put on clean/comfortable clothes.  Brush your teeth.  Ask your nurse before applying any prescription medications to the skin.      CHG Compatible Lotions   Aveeno Moisturizing lotion  Cetaphil Moisturizing Cream  Cetaphil Moisturizing Lotion  Clairol Herbal Essence Moisturizing Lotion, Dry Skin  Clairol Herbal Essence Moisturizing Lotion, Extra Dry Skin  Clairol Herbal Essence Moisturizing Lotion, Normal Skin  Curel Age Defying Therapeutic Moisturizing Lotion with Alpha Hydroxy  Curel Extreme Care Body Lotion  Curel Soothing Hands Moisturizing Hand Lotion  Curel Therapeutic Moisturizing Cream, Fragrance-Free  Curel Therapeutic Moisturizing Lotion, Fragrance-Free  Curel Therapeutic Moisturizing Lotion, Original Formula  Eucerin Daily Replenishing Lotion  Eucerin Dry Skin Therapy Plus Alpha Hydroxy Crme  Eucerin Dry Skin Therapy Plus Alpha Hydroxy Lotion  Eucerin Original Crme  Eucerin Original Lotion  Eucerin Plus Crme Eucerin Plus Lotion  Eucerin TriLipid Replenishing Lotion  Keri Anti-Bacterial Hand Lotion  Keri Deep Conditioning Original Lotion Dry Skin Formula Softly Scented  Keri Deep Conditioning Original Lotion, Fragrance Free Sensitive Skin Formula  Keri Lotion Fast Absorbing Fragrance Free Sensitive Skin Formula  Keri Lotion Fast Absorbing Softly Scented Dry Skin Formula  Keri Original Lotion  Keri Skin Renewal Lotion Keri Silky Smooth Lotion  Keri Silky Smooth Sensitive Skin Lotion  Nivea Body Creamy Conditioning Oil  Nivea Body Extra Enriched Lotion  Nivea Body Original Lotion  Nivea Body Sheer Moisturizing Lotion  Nivea Crme  Nivea Skin Firming Lotion  NutraDerm 30 Skin Lotion  NutraDerm Skin Lotion  NutraDerm Therapeutic Skin Cream  NutraDerm Therapeutic Skin Lotion  ProShield Protective Hand Cream  Provon moisturizing lotion   View Pre-Surgery Education Videos:  [email or web address]     Incentive Spirometer  An incentive spirometer is a tool that can help keep your lungs clear and active. This tool measures how well you are filling your lungs with each breath. Taking long deep breaths may help reverse or decrease the chance of developing breathing (pulmonary) problems (especially infection) following: A long period of time when you are unable to move or be active. BEFORE THE PROCEDURE  If the spirometer includes an indicator to show your best effort, your nurse or respiratory therapist will set it to a desired goal. If possible, sit up straight or lean slightly forward. Try not to slouch. Hold the incentive spirometer in an upright position. INSTRUCTIONS FOR USE  Sit on the edge of your bed if possible, or sit up as far as you can in bed or on a chair. Hold the incentive spirometer in an upright position. Breathe out normally. Place the mouthpiece in your  mouth and seal your lips tightly around it. Breathe in slowly and as deeply as possible, raising the piston or the ball toward the top of the column. Hold your breath for 3-5 seconds or for as long as possible. Allow the piston or ball to fall to the bottom of the column. Remove the mouthpiece from your mouth and breathe out normally. Rest for a few seconds and repeat Steps 1 through 7 at least 10 times every 1-2 hours when you are awake. Take your time and take a few normal breaths between deep breaths. The spirometer may include an indicator to show your best effort. Use the indicator as a goal to work toward during each repetition. After each set of 10 deep breaths, practice  coughing to be sure your lungs are clear. If you have an incision (the cut made at the time of surgery), support your incision when coughing by placing a pillow or rolled up towels firmly against it. Once you are able to get out of bed, walk around indoors and cough well. You may stop using the incentive spirometer when instructed by your caregiver.  RISKS AND COMPLICATIONS Take your time so you do not get dizzy or light-headed. If you are in pain, you may need to take or ask for pain medication before doing incentive spirometry. It is harder to take a deep breath if you are having pain. AFTER USE Rest and breathe slowly and easily. It can be helpful to keep track of a log of your progress. Your caregiver can provide you with a simple table to help with this. If you are using the spirometer at home, follow these instructions: SEEK MEDICAL CARE IF:  You are having difficultly using the spirometer. You have trouble using the spirometer as often as instructed. Your pain medication is not giving enough relief while using the spirometer. You develop fever of 100.5 F (38.1 C) or higher. SEEK IMMEDIATE MEDICAL CARE IF:  You cough up bloody sputum that had not been present before. You develop fever of 102 F (38.9 C) or greater. You develop worsening pain at or near the incision site. MAKE SURE YOU:  Understand these instructions. Will watch your condition. Will get help right away if you are not doing well or get worse. Document Released: 05/19/2006 Document Revised: 03/31/2011 Document Reviewed: 07/20/2006 [facility] Patient Information 2014 [place], [place].   ________________________________________________________________________

## 2023-07-27 NOTE — Progress Notes (Signed)
 Follow-up Note  RE: Rebecca Dougherty MRN: [id number] DOB: 1970-10-17 Date of Office Visit: 07/27/2023  Primary care provider: Garwin Lum Fuse, PA-C (Inactive) Referring provider: No ref. provider found   Rebecca Dougherty returns to the office today for the patch test placement, given suspected history of contact dermatitis.  All questions answered at today's visit.  Diagnostics: NAC 80 patches placed NAC-80 (1-80)   1. Ammonium persulfate  2. [place] Balsam  3. Omitted  4. 4-tert-Butylphenolformaldehyde resin (PTBP)  5. Bacitracin  6. Budesonide  7. Quaternium-15  8. Cinnamal  9. Cobalt(II) chloride hexahydrate  10. Colophonium  11. Methyldibromo glutaronitrile  12. Decyl Glucoside  13. Ethylenediamine dihydrochloride  14. 2-Hydroxyethyl methacrylate  15. Hydroperoxides of Linalool  16. Iodopropynyl butylcarbamate  17. 2-Mercaptobenzothiazole (MBT)  18. Thiuram mix  19. METHYLISOTHIAZOLINONE  20. Propylene glycol  21. 1,3-Diphenylguanidine  22. Hydroperoxides of Limonene  23. Black rubber mix  24. Carba mix  25. Fragrance mix I  26. Fragrance mix II  27. Textile dye mix II  28. Neomycin sulfate  29. Nickel(II) sulfate hexahydrate  30. p-Phenylenediamine (PPD)  31. Potassium dichromate  32. Propolis  33. Sodium Metabisulfite  34. Tixocortol-21-pivalate  35. Lanolin alcohol  36. Methylisothiazolinone + Methylchloroisothiazolinone  37. Cocamidopropyl betaine  38. 3-(Dimethylamino)-1-propylamine  39. Formaldehyde  40. Oleamidopropyl dimethylamine  41. 2-Bromo-2-Nitropropane-l,3-diol  42. Diazolidinyl urea  43. DMDM Hydantoin  44. Epoxy resin, Bisphenol A  45. Benzophenone-4  46. Imidazolidinyl urea  47. Lauryl polyglucose  48 Methyl methacrylate  49. Paraben mix  50. Mercapto mix  51. Caine mix III  52. Mixed dialkyl thiourea  53. Compositae mix II  54. Toluenesulfonamide formaldehyde resin  55. Tea Tree Oil oxidized  56. Ylang-Ylang oil  57. Amidoamine   58. Amerchol L 101  59. Benzocaine  60. Benzyl alchohol  61. Benzyl salicylate  62. Chloroxylenol (PCMX)  63. Cocamide DEA  64. Clobetasol-17-propionate  65. Toluene-2,5-Diamine sulfate  66. Ethyl acrylate  67. N-Isopropyl-N-phenyl--4-phenylenediamine (IPPD)  68. Lidocaine   69. omitted  70. Sesquiterpene lactone mix  71. 2-n-Octyl-4-isothiazolin-3-one  72. Propyl gallate  73. Polymyxin B sulfate  74. Pramoxine hydrochloride  75. Sodium benzoate  76. Sorbitan oleate  77. Sorbitan sesquioleate  78. Tocopherol  79. BENZALKONIUM CHLORIDE  80. Chlorhexidine  digluconate    Allergic contact dermatitis - Instructions provided on care of the patches for the next 48 hours. - Rebecca Dougherty was instructed to avoid showering for the next 48 hours. - Rebecca Dougherty will follow up in 48 hours and 96 hours for patch readings.   Call the clinic if this treatment plan is not working well for you  Follow up in 2 days or sooner if needed.  Thank you for the opportunity to care for this patient.  Please do not hesitate to contact me with questions.  Arlean Mutter, FNP [facility]  [facility]

## 2023-07-27 NOTE — Progress Notes (Signed)
 COVID Vaccine Completed: yes  Date of COVID positive in last 90 days:  PCP - Gerard Roys, PA Cardiologist - n/a  Medical/cardiac clearance in media tab dated 07/30/23  Chest x-ray - n/a EKG - n/a Stress Test - n/a ECHO - n/a Cardiac Cath - n/a Pacemaker/ICD device last checked: n/a Spinal Cord Stimulator:n/a  Bowel Prep - no  Sleep Study - n/a CPAP -   Fasting Blood Sugar - preDM, takes Metformin, no BS checks at home Checks Blood Sugar _____ times a day  Last dose of GLP1 agonist-  N/A GLP1 instructions:  Do not take after     Last dose of SGLT-2 inhibitors-  N/A SGLT-2 instructions:  Do not take after     Blood Thinner Instructions:  Last dose: n/a  Time: Aspirin Instructions: Last Dose:  Activity level: Can go up a flight of stairs and perform activities of daily living without stopping and without symptoms of chest pain or shortness of breath.  Anesthesia review:   Patient denies shortness of breath, fever, cough and chest pain at PAT appointment  Patient verbalized understanding of instructions that were given to them at the PAT appointment. Patient was also instructed that they will need to review over the PAT instructions again at home before surgery.

## 2023-07-28 ENCOUNTER — Other Ambulatory Visit: Payer: Self-pay

## 2023-07-28 ENCOUNTER — Encounter (HOSPITAL_COMMUNITY): Payer: Self-pay

## 2023-07-28 ENCOUNTER — Encounter (HOSPITAL_COMMUNITY)
Admission: RE | Admit: 2023-07-28 | Discharge: 2023-07-28 | Disposition: A | Discharge status: Home / Self Care | Source: Ambulatory Visit | Attending: Orthopedic Surgery | Admitting: Orthopedic Surgery

## 2023-07-28 VITALS — BP 120/78 | HR 74 | Temp 98.4°F | Resp 14 | Ht 66.0 in | Wt 233.0 lb

## 2023-07-28 DIAGNOSIS — D649 Anemia, unspecified: Secondary | ICD-10-CM | POA: Insufficient documentation

## 2023-07-28 DIAGNOSIS — Z01818 Encounter for other preprocedural examination: Secondary | ICD-10-CM

## 2023-07-28 DIAGNOSIS — Z01812 Encounter for preprocedural laboratory examination: Secondary | ICD-10-CM | POA: Diagnosis present

## 2023-07-28 HISTORY — DX: Gastro-esophageal reflux disease without esophagitis: K21.9

## 2023-07-28 HISTORY — DX: Prediabetes: R73.03

## 2023-07-28 LAB — CBC
HCT: 42.6 % (ref 36.0–46.0)
Hemoglobin: 13.7 g/dL (ref 12.0–15.0)
MCH: 30.9 pg (ref 26.0–34.0)
MCHC: 32.2 g/dL (ref 30.0–36.0)
MCV: 95.9 fL (ref 80.0–100.0)
Platelets: 257 K/uL (ref 150–400)
RBC: 4.44 MIL/uL (ref 3.87–5.11)
RDW: 13.2 % (ref 11.5–15.5)
WBC: 6.9 K/uL (ref 4.0–10.5)
nRBC: 0 % (ref 0.0–0.2)

## 2023-07-28 LAB — SURGICAL PCR SCREEN
MRSA, PCR: NEGATIVE
Staphylococcus aureus: NEGATIVE

## 2023-07-29 ENCOUNTER — Ambulatory Visit (INDEPENDENT_AMBULATORY_CARE_PROVIDER_SITE_OTHER): Admitting: Family Medicine

## 2023-07-29 ENCOUNTER — Encounter: Payer: Self-pay | Admitting: Family Medicine

## 2023-07-29 DIAGNOSIS — L230 Allergic contact dermatitis due to metals: Secondary | ICD-10-CM

## 2023-07-29 NOTE — Patient Instructions (Addendum)
 Diagnostics:   NAC 80 48-hour hour reading:  NAC Panel Tested NAC-80 (1-80)   1. Ammonium persulfate Negative   2. [place] Balsam Negative   3. BENZISOTHIAZOLINONE Comment   omit  4. 4-tert-Butylphenolformaldehyde resin (PTBP) Negative   5. Bacitracin Negative   6. Budesonide Negative   7. Quaternium-15 Negative   8. Cinnamal Negative   9. Cobalt(II) chloride hexahydrate Negative  10. Colophonium Negative   11. Methyldibromo glutaronitrile Negative   12. Decyl Glucoside Negative   13. Ethylenediamine dihydrochloride Negative   14. 2-Hydroxyethyl methacrylate Negative   15. Hydroperoxides of Linalool Negative   16. Iodopropynyl butylcarbamate Negative   17. 2-Mercaptobenzothiazole (MBT) Negative   18. Thiuram mix Negative   19. METHYLISOTHIAZOLINONE Negative   20. Propylene glycol Negative   21. 1,3-Diphenylguanidine Negative   22. Hydroperoxides of Limonene Negative   23. Black rubber mix Negative   24. Carba mix Negative   25. Fragrance mix I Negative   26. Fragrance mix II Negative   27. Textile dye mix II Negative   28. Neomycin sulfate Negative   29. Nickel(II) sulfate hexahydrate Negative  30. p-Phenylenediamine (PPD) Negative   31. Potassium dichromate Negative   32. Propolis Negative   33. Sodium Metabisulfite Negative   34. Tixocortol-21-pivalate Negative   35. Lanolin alcohol Negative   36. Methylisothiazolinone + Methylchloroisothiazolinone Negative   37. Cocamidopropyl betaine Negative   38. 3-(Dimethylamino)-1-propylamine Negative   39. Formaldehyde  +/-  40. Oleamidopropyl dimethylamine Negative   41. 2-Bromo-2-Nitropropane-l,3-diol Negative   42. Diazolidinyl urea Negative  43. DMDM Hydantoin Negative   44. Epoxy resin, Bisphenol A Negative   45. Benzophenone-4 Negative   46. Imidazolidinyl urea Negative   47. Lauryl polyglucose  +/-  48 Methyl methacrylate Negative   49. Paraben mix Negative   50. Mercapto mix Negative   51. Caine mix III Negative    52. Mixed dialkyl thiourea Negative   53. Compositae mix II Negative   54. Toluenesulfonamide formaldehyde resin Negative   55. Tea Tree Oil oxidized Negative   56. Ylang-Ylang oil Negative   57. Amidoamine Negative   58. Amerchol L 101 Negative   59. Benzocaine Negative   60. Benzyl alchohol Negative   61. Benzyl salicylate Negative   62. Chloroxylenol (PCMX) Negative   63. Cocamide DEA Negative   64. Clobetasol-17-propionate Negative   65. Toluene-2,5-Diamine sulfate Negative   66. Ethyl acrylate Negative   67. N-Isopropyl-N-phenyl--4-phenylenediamine (IPPD) Negative   68. Lidocaine  Negative   69. Hydroxyisohexyl 3-Cyclohexene Carboxaldehyde Comment   omit  70. Sesquiterpene lactone mix Negative   71. 2-n-Octyl-4-isothiazolin-3-one Negative   72. Propyl gallate Negative   73. Polymyxin B sulfate Negative   74. Pramoxine hydrochloride Negative   75. Sodium benzoate Negative   76. Sorbitan oleate Negative   77. Sorbitan sesquioleate Negative   78. Tocopherol Negative   79. BENZALKONIUM CHLORIDE Negative   80. Chlorhexidine  digluconate Negative    Metal patches placed: chloride 1%, copper sulfate pentahydrate 2%, molybdenum chloride 0.5%, titanium 0.1%, tantal, magnese chloride 0.5%, aluminum hydroxide 10%, and vanadium pentoxide 10%.  Discussed with patient that patch testing tests for contact dermatitis and sometimes it does not correlate to how one will react to metals in the body. Positive patch testing results can help in avoiding those items however it is possible to get false negative results.  Nevertheless, this is the most accessible test for metal sensitivity currently available.  Metal Patches placed today. Please avoid strenuous physical activities and do not get  the patches on the back wet. No showering until final patch reading done. Okay to take antihistamines for itching but avoid placing any creams on the back where the patches are. We will remove the patches on  Friday and will do our initial read. Then you will come back on Monday, and on Wednesday for a final read   Plan:   Allergic contact dermatitis - The patient has been provided detailed information regarding the substances she is sensitive to, as well as products containing the substances.   - Meticulous avoidance of these substances is recommended.  - If avoidance is not possible, the use of barrier creams or lotions is recommended. - If symptoms persist or progress despite meticulous avoidance of the substances as listed above, a dermatology referral may be warranted. - Follow up on Friday for the final NAC patch reading. We will remove metal patches on Friday and perform the first reading. Follow up in the clinic on the following Monday and Wednesday for metal patch readings.   Call the clinic if this treatment plan is not working well for you  Follow up in 2 days or sooner if needed.

## 2023-07-29 NOTE — Progress Notes (Signed)
 Bilateral   Follow-up Note  RE: Rebecca Dougherty MRN: [id number] DOB: [birth year] Date of Office Visit: 07/29/2023  Primary care provider: Douglass Gerard DEL, PA-C Referring provider: No ref. provider found   Sunshine returns to the office today for the initial patch test interpretation, given suspected history of contact dermatitis as well as metal patch placement.    Diagnostics:   NAC 80 48-hour reading:  NAC Panel Tested NAC-80 (1-80)   1. Ammonium persulfate Negative   2. [place] Balsam Negative   3. BENZISOTHIAZOLINONE Comment   omit  4. 4-tert-Butylphenolformaldehyde resin (PTBP) Negative   5. Bacitracin Negative   6. Budesonide Negative   7. Quaternium-15 Negative   8. Cinnamal Negative   9. Cobalt(II) chloride hexahydrate Negative  10. Colophonium Negative   11. Methyldibromo glutaronitrile Negative   12. Decyl Glucoside Negative   13. Ethylenediamine dihydrochloride Negative   14. 2-Hydroxyethyl methacrylate Negative   15. Hydroperoxides of Linalool Negative   16. Iodopropynyl butylcarbamate Negative   17. 2-Mercaptobenzothiazole (MBT) Negative   18. Thiuram mix Negative   19. METHYLISOTHIAZOLINONE Negative   20. Propylene glycol Negative   21. 1,3-Diphenylguanidine Negative   22. Hydroperoxides of Limonene Negative   23. Black rubber mix Negative   24. Carba mix Negative   25. Fragrance mix I Negative   26. Fragrance mix II Negative   27. Textile dye mix II Negative   28. Neomycin sulfate Negative   29. Nickel(II) sulfate hexahydrate Negative  30. p-Phenylenediamine (PPD) Negative   31. Potassium dichromate Negative   32. Propolis Negative   33. Sodium Metabisulfite Negative   34. Tixocortol-21-pivalate Negative   35. Lanolin alcohol Negative   36. Methylisothiazolinone + Methylchloroisothiazolinone Negative   37. Cocamidopropyl betaine Negative   38. 3-(Dimethylamino)-1-propylamine Negative   39. Formaldehyde  +/-  40. Oleamidopropyl dimethylamine  Negative   41. 2-Bromo-2-Nitropropane-l,3-diol Negative   42. Diazolidinyl urea Negative  43. DMDM Hydantoin Negative   44. Epoxy resin, Bisphenol A Negative   45. Benzophenone-4 Negative   46. Imidazolidinyl urea Negative   47. Lauryl polyglucose  +/-  48 Methyl methacrylate Negative   49. Paraben mix Negative   50. Mercapto mix Negative   51. Caine mix III Negative   52. Mixed dialkyl thiourea Negative   53. Compositae mix II Negative   54. Toluenesulfonamide formaldehyde resin Negative   55. Tea Tree Oil oxidized Negative   56. Ylang-Ylang oil Negative   57. Amidoamine Negative   58. Amerchol L 101 Negative   59. Benzocaine Negative   60. Benzyl alchohol Negative   61. Benzyl salicylate Negative   62. Chloroxylenol (PCMX) Negative   63. Cocamide DEA Negative   64. Clobetasol-17-propionate Negative   65. Toluene-2,5-Diamine sulfate Negative   66. Ethyl acrylate Negative   67. N-Isopropyl-N-phenyl--4-phenylenediamine (IPPD) Negative   68. Lidocaine  Negative   69. Hydroxyisohexyl 3-Cyclohexene Carboxaldehyde Comment   omit  70. Sesquiterpene lactone mix Negative   71. 2-n-Octyl-4-isothiazolin-3-one Negative   72. Propyl gallate Negative   73. Polymyxin B sulfate Negative   74. Pramoxine hydrochloride Negative   75. Sodium benzoate Negative   76. Sorbitan oleate Negative   77. Sorbitan sesquioleate Negative   78. Tocopherol Negative   79. BENZALKONIUM CHLORIDE Negative   80. Chlorhexidine  digluconate Negative    Metal patches placed at today's visit including: chloride 1%, copper sulfate pentahydrate 2%, molybdenum chloride 0.5%, titanium 0.1%, tantal, magnese chloride 0.5%, aluminum hydroxide 10%, and vanadium pentoxide 10%.  Discussed with patient that patch testing tests for  contact dermatitis and sometimes it does not correlate to how one will react to metals in the body. Positive patch testing results can help in avoiding those items however it is possible to get false  negative results.  Nevertheless, this is the most accessible test for metal sensitivity currently available.  Metal Patches placed today. Please avoid strenuous physical activities and do not get the patches on the back wet. No showering until final patch reading done. Okay to take antihistamines for itching but avoid placing any creams on the back where the patches are. We will remove the patches on Friday and will do our initial read. Then you will come back on Monday, and on Wednesday for a final read   Plan:   Allergic contact dermatitis - The patient has been provided detailed information regarding the substances on NAC 80 testing that she is sensitive to, as well as products containing the substances.   - Meticulous avoidance of these substances is recommended.  - If avoidance is not possible, the use of barrier creams or lotions is recommended. - If symptoms persist or progress despite meticulous avoidance of the substances as listed above, a dermatology referral may be warranted. - Follow up on Friday for the final NAC patch reading. We will remove metal patches on Friday and perform the first reading. Follow up in the clinic on the following Monday and Wednesday for metal patch readings.   Call the clinic if this treatment plan is not working well for you  Follow up in 2 days or sooner if needed.   Thank you for the opportunity to care for this patient.  Please do not hesitate to contact me with questions.  Arlean Mutter, FNP [facility]  [facility]

## 2023-07-30 NOTE — Progress Notes (Signed)
 Follow-up   Follow-up Note  RE: Rebecca Dougherty MRN: [id number] DOB: October 04, 1970 Date of Office Visit: 07/31/2023  Primary care provider: Douglass Gerard DEL, PA-C Referring provider: No ref. provider found   Addysen returns to the office today for the final NAC 80 patch test interpretation and removal or metals patch testing with the first reading, given suspected history of contact dermatitis.    Diagnostics:    NAC 80 48-hour reading:  NAC Panel Tested NAC-80 (1-80)   1. Ammonium persulfate Negative   2. [place] Balsam Negative   3. BENZISOTHIAZOLINONE Comment   omit  4. 4-tert-Butylphenolformaldehyde resin (PTBP) Negative   5. Bacitracin Negative   6. Budesonide Negative   7. Quaternium-15 Negative   8. Cinnamal Negative   9. Cobalt(II) chloride hexahydrate Negative  10. Colophonium Negative   11. Methyldibromo glutaronitrile Negative   12. Decyl Glucoside Negative   13. Ethylenediamine dihydrochloride Negative   14. 2-Hydroxyethyl methacrylate Negative   15. Hydroperoxides of Linalool Negative   16. Iodopropynyl butylcarbamate Negative   17. 2-Mercaptobenzothiazole (MBT) Negative   18. Thiuram mix Negative   19. METHYLISOTHIAZOLINONE Negative   20. Propylene glycol Negative   21. 1,3-Diphenylguanidine Negative   22. Hydroperoxides of Limonene Negative   23. Black rubber mix Negative   24. Carba mix Negative   25. Fragrance mix I Negative   26. Fragrance mix II Negative   27. Textile dye mix II +/-  28. Neomycin sulfate Negative   29. Nickel(II) sulfate hexahydrate 3+  30. p-Phenylenediamine (PPD) Negative   31. Potassium dichromate Negative   32. Propolis Negative   33. Sodium Metabisulfite Negative   34. Tixocortol-21-pivalate Negative   35. Lanolin alcohol Negative   36. Methylisothiazolinone + Methylchloroisothiazolinone 2+  37. Cocamidopropyl betaine Negative   38. 3-(Dimethylamino)-1-propylamine 2+ right  39. Formaldehyde Negative  40. Oleamidopropyl  dimethylamine Negative   41. 2-Bromo-2-Nitropropane-l,3-diol Negative   42. Diazolidinyl urea Negative  43. DMDM Hydantoin Negative   44. Epoxy resin, Bisphenol A Negative   45. Benzophenone-4 Negative   46. Imidazolidinyl urea Negative   47. Lauryl polyglucose Negative  48 Methyl methacrylate Negative   49. Paraben mix Negative   50. Mercapto mix Negative   51. Caine mix III Negative   52. Mixed dialkyl thiourea Negative   53. Compositae mix II Negative   54. Toluenesulfonamide formaldehyde resin Negative   55. Tea Tree Oil oxidized Negative   56. Ylang-Ylang oil Negative   57. Amidoamine Negative   58. Amerchol L 101 Negative   59. Benzocaine Negative   60. Benzyl alchohol Negative   61. Benzyl salicylate Negative   62. Chloroxylenol (PCMX) Negative   63. Cocamide DEA Negative   64. Clobetasol-17-propionate Negative   65. Toluene-2,5-Diamine sulfate Negative   66. Ethyl acrylate Negative   67. N-Isopropyl-N-phenyl--4-phenylenediamine (IPPD) Negative   68. Lidocaine  Negative   69. Hydroxyisohexyl 3-Cyclohexene Carboxaldehyde Comment   omit  70. Sesquiterpene lactone mix Negative   71. 2-n-Octyl-4-isothiazolin-3-one Negative   72. Propyl gallate Negative   73. Polymyxin B sulfate Negative   74. Pramoxine hydrochloride Negative   75. Sodium benzoate Negative   76. Sorbitan oleate Negative   77. Sorbitan sesquioleate Negative   78. Tocopherol Negative   79. BENZALKONIUM CHLORIDE Negative   80. Chlorhexidine  digluconate Negative     Metals Patch     Time Antigen Placed  07/29/2023    Location  Back    Number of Test  8    Reading Interval  48 hours  Chromium chloride 1%  0     Molybdenum chloride 0.5%  0     Nickel sulfate hexahydrate 5%  0     Copper sulfate pentahydrate 2%  0     Titanium 0.1%  0     Manganese chloride 0.5%  0     Tantal 1%  0    Vanadium pentoxide 10%  0   Aluminum hydroxide 10%  0    Comments  N/A   Nickel  sulfate hexahydrate 5% placed at  today's visit, per patient's request. She will return on Monday for nickel sulfate hexahydrate 5% patch removal and first reading. She will return for subsequent readings on Wednesday and Friday.   Plan:   Allergic contact dermatitis - The patient has been provided detailed information regarding the substances she is sensitive to, as well as products containing the substances.   - Meticulous avoidance of these substances is recommended.  - If avoidance is not possible, the use of barrier creams or lotions is recommended. - If symptoms persist or progress despite meticulous avoidance of the substances as listed above, a dermatology referral may be warranted. - The sensitivity of patch testing can range from 60-80% and therefore false negative results are possible. Therefore, this does not definitively rule out contact dermatitis.  - Return to the clinic on Monday for your second metal patch reading.  On Monday we will remove the nickel patch from your back and perform the first reading.  Return to the clinic on Wednesday for your final metal patch reading and second nickel patch reading.  Return to the clinic on Friday for your final nickel patch reading.  Call the clinic if this treatment plan is not working well for you  Follow up in 3 days or sooner if needed.  Thank you for the opportunity to care for this patient.  Please do not hesitate to contact me with questions.  Arlean Mutter, FNP [facility]  [facility]

## 2023-07-31 ENCOUNTER — Encounter: Payer: Self-pay | Admitting: Family Medicine

## 2023-07-31 ENCOUNTER — Ambulatory Visit (INDEPENDENT_AMBULATORY_CARE_PROVIDER_SITE_OTHER): Admitting: Family Medicine

## 2023-07-31 DIAGNOSIS — L230 Allergic contact dermatitis due to metals: Secondary | ICD-10-CM | POA: Diagnosis not present

## 2023-07-31 NOTE — Patient Instructions (Addendum)
 Diagnostics:    NAC 80 48-hour reading:  NAC Panel Tested NAC-80 (1-80)   1. Ammonium persulfate Negative   2. [place] Balsam Negative   3. BENZISOTHIAZOLINONE Comment   omit  4. 4-tert-Butylphenolformaldehyde resin (PTBP) Negative   5. Bacitracin Negative   6. Budesonide Negative   7. Quaternium-15 Negative   8. Cinnamal Negative   9. Cobalt(II) chloride hexahydrate Negative  10. Colophonium Negative   11. Methyldibromo glutaronitrile Negative   12. Decyl Glucoside Negative   13. Ethylenediamine dihydrochloride Negative   14. 2-Hydroxyethyl methacrylate Negative   15. Hydroperoxides of Linalool Negative   16. Iodopropynyl butylcarbamate Negative   17. 2-Mercaptobenzothiazole (MBT) Negative   18. Thiuram mix Negative   19. METHYLISOTHIAZOLINONE Negative   20. Propylene glycol Negative   21. 1,3-Diphenylguanidine Negative   22. Hydroperoxides of Limonene Negative   23. Black rubber mix Negative   24. Carba mix Negative   25. Fragrance mix I Negative   26. Fragrance mix II Negative   27. Textile dye mix II +/-  28. Neomycin sulfate Negative   29. Nickel(II) sulfate hexahydrate Negative  30. p-Phenylenediamine (PPD) 3+  31. Potassium dichromate Negative   32. Propolis Negative   33. Sodium Metabisulfite Negative   34. Tixocortol-21-pivalate Negative   35. Lanolin alcohol Negative   36. Methylisothiazolinone + Methylchloroisothiazolinone 2+  37. Cocamidopropyl betaine Negative   38. 3-(Dimethylamino)-1-propylamine 2+   39. Formaldehyde Negative  40. Oleamidopropyl dimethylamine Negative   41. 2-Bromo-2-Nitropropane-l,3-diol Negative   42. Diazolidinyl urea Negative  43. DMDM Hydantoin Negative   44. Epoxy resin, Bisphenol A Negative   45. Benzophenone-4 Negative   46. Imidazolidinyl urea Negative   47. Lauryl polyglucose Negative  48 Methyl methacrylate Negative   49. Paraben mix Negative   50. Mercapto mix Negative   51. Caine mix III Negative   52. Mixed dialkyl  thiourea Negative   53. Compositae mix II Negative   54. Toluenesulfonamide formaldehyde resin Negative   55. Tea Tree Oil oxidized Negative   56. Ylang-Ylang oil Negative   57. Amidoamine Negative   58. Amerchol L 101 Negative   59. Benzocaine Negative   60. Benzyl alchohol Negative   61. Benzyl salicylate Negative   62. Chloroxylenol (PCMX) Negative   63. Cocamide DEA Negative   64. Clobetasol-17-propionate Negative   65. Toluene-2,5-Diamine sulfate Negative   66. Ethyl acrylate Negative   67. N-Isopropyl-N-phenyl--4-phenylenediamine (IPPD) Negative   68. Lidocaine  Negative   69. Hydroxyisohexyl 3-Cyclohexene Carboxaldehyde Comment   omit  70. Sesquiterpene lactone mix Negative   71. 2-n-Octyl-4-isothiazolin-3-one Negative   72. Propyl gallate Negative   73. Polymyxin B sulfate Negative   74. Pramoxine hydrochloride Negative   75. Sodium benzoate Negative   76. Sorbitan oleate Negative   77. Sorbitan sesquioleate Negative   78. Tocopherol Negative   79. BENZALKONIUM CHLORIDE Negative   80. Chlorhexidine  digluconate Negative     Metals Patch     Time Antigen Placed  07/29/2023    Location  Back    Number of Test  8    Reading Interval  48 hours    Chromium chloride 1%  0     Molybdenum chloride 0.5%  0     Nickel sulfate hexahydrate 5%  0     Copper sulfate pentahydrate 2%  0     Titanium 0.1%  0     Manganese chloride 0.5%  0     Tantal 1%  0    Vanadium pentoxide 10%  0   Aluminum hydroxide 10%  0    Comments  N/A    Plan:   Allergic contact dermatitis - The patient has been provided detailed information regarding the substances she is sensitive to, as well as products containing the substances.   - Meticulous avoidance of these substances is recommended.  - If avoidance is not possible, the use of barrier creams or lotions is recommended. - If symptoms persist or progress despite meticulous avoidance of the substances as listed above, a dermatology referral may  be warranted. - The sensitivity of patch testing can range from 60-80% and therefore false negative results are possible. Therefore, this does not definitively rule out contact dermatitis.  - Return to the clinic on Monday for your second metal patch reading.  On Monday we will remove the nickel patch from your back and perform the first reading.  Return to the clinic on Wednesday for your final metal patch reading and second nickel patch reading.  Return to the clinic on Friday for your final nickel patch reading.  Call the clinic if this treatment plan is not working well for you  Follow up in 3 days or sooner if needed.

## 2023-08-02 NOTE — Progress Notes (Unsigned)
  Metals Patch     Time Antigen Placed      Location     Number of Test     Reading Interval  Day    Chromium chloride 1%  0     Cobalt chloride hexahydrate 1%  0     Molybdenum chloride 0.5%  0     Nickel sulfate hexahydrate 5%  0     Potassium dichromate 0.25%  0     Copper sulfate pentahydrate 2%  0     Titanium 0.1%  0     Manganese chloride 0.5%  0     Tantal 1%  0    Vanadium pentoxide 10%  0   Aluminum hydroxide 10%  0    Nickel sulfate hexahydrate 5%  0     Comments  ***   Discussed with patient that patch testing tests for contact dermatitis and sometimes it does not correlate to how one will react to metals in the body. Positive patch testing results can help in avoiding those items however it is possible to get false negative results.  Nevertheless, this is the most accessible test for metal sensitivity currently available.  Metal Patches placed today. Please avoid strenuous physical activities and do not get the patches on the back wet. No showering until final patch reading done. Okay to take antihistamines for itching but avoid placing any creams on the back where the patches are. We will remove the patches on Wednesday and will do our initial read. Then you will come back on Friday for a final read

## 2023-08-03 ENCOUNTER — Encounter: Payer: Self-pay | Admitting: Family Medicine

## 2023-08-03 ENCOUNTER — Ambulatory Visit (INDEPENDENT_AMBULATORY_CARE_PROVIDER_SITE_OTHER): Admitting: Family Medicine

## 2023-08-03 DIAGNOSIS — L230 Allergic contact dermatitis due to metals: Secondary | ICD-10-CM | POA: Diagnosis not present

## 2023-08-03 NOTE — Patient Instructions (Addendum)
  Metals Patch     Time Antigen Placed  07/29/18    Location  Back    Number of Test  8    Reading Interval  Day 5    Chromium chloride 1%  0     Molybdenum chloride 0.5%  +/-    Copper sulfate pentahydrate 2%  0     Titanium 0.1%  0     Manganese chloride 0.5%  0     Tantal 1%  0    Vanadium pentoxide 10%  0   Aluminum hydroxide 10%  0    Comments  N/A   Nickel patch testing placed on-07/31/23- Negative at the first reading today  Discussed with patient that patch testing tests for contact dermatitis and sometimes it does not correlate to how one will react to metals in the body. Positive patch testing results can help in avoiding those items however it is possible to get false negative results.  Nevertheless, this is the most accessible test for metal sensitivity currently available.  Okay to take antihistamines for itching but avoid placing any creams on the back where the patches are. Return on Wednesday for final metal patch reading.  Return on Friday for your final nickel metal reading  Call the clinic if this treatment plan is not working well for you  Follow up in 2 days or sooner if needed.

## 2023-08-05 ENCOUNTER — Encounter: Payer: Self-pay | Admitting: Family

## 2023-08-05 ENCOUNTER — Ambulatory Visit (INDEPENDENT_AMBULATORY_CARE_PROVIDER_SITE_OTHER): Admitting: Family

## 2023-08-05 DIAGNOSIS — L230 Allergic contact dermatitis due to metals: Secondary | ICD-10-CM

## 2023-08-05 NOTE — Progress Notes (Signed)
 Ryka returns to the office today for the patch test interpretation, given suspected history of contact dermatitis.    Diagnostics:   Metal  patch test reading:  2nd nickel sulfate patch reading+/- (borderline positive)  Final reading for metal patch testing: +/- (borderline positive) to  molybdenum chloride 0.5%    Plan:   Allergic contact dermatitis - The patient has been provided detailed information regarding the substances she is sensitive to, as well as products containing the substances.   - Meticulous avoidance of these substances is recommended.  - If avoidance is not possible, the use of barrier creams or lotions is recommended. - If symptoms persist or progress despite meticulous avoidance of molybdenum chloride 0.5% and nickel sulfate, Dermatology Referral may be warranted.  Discussed with patient that patch testing tests for contact dermatitis and sometimes it does not correlate to how one will react to metals in the body. Positive patch testing results can help in avoiding those items however it is possible to get false negative results.  Nevertheless, this is the most accessible test for metal sensitivity currently available.  Okay to take antihistamines for itching but avoid placing any creams on the back where the patches are.  Return on Friday for your final nickel metal reading  Wanda Craze, FNP [facility]

## 2023-08-07 ENCOUNTER — Encounter: Payer: Self-pay | Admitting: Allergy

## 2023-08-07 ENCOUNTER — Ambulatory Visit (INDEPENDENT_AMBULATORY_CARE_PROVIDER_SITE_OTHER): Admitting: Allergy

## 2023-08-07 DIAGNOSIS — L230 Allergic contact dermatitis due to metals: Secondary | ICD-10-CM | POA: Diagnosis not present

## 2023-08-07 DIAGNOSIS — L235 Allergic contact dermatitis due to other chemical products: Secondary | ICD-10-CM | POA: Diagnosis not present

## 2023-08-07 NOTE — Progress Notes (Signed)
    Follow-up Note  RE: Rebecca Dougherty MRN: [id number] DOB: 1970/10/15 Date of Office Visit: 08/07/2023  Primary care provider: Douglass Gerard DEL, PA-C Referring provider: Douglass Gerard DEL, PA-C   Rebecca Dougherty returns to the office today for the final patch test interpretation for nickel, given suspected history of contact dermatitis.    Diagnostics:  Nickel patch testing 96 hour reading: 1+    Plan:  Allergic contact dermatitis The patient has been provided detailed information regarding the substances she is sensitive to, as well as products containing the substances.  Meticulous avoidance of these substances is recommended. If avoidance is not possible, the use of barrier creams or lotions is recommended. Her complete patch testing results are as follows: Positive test to nickel sulfate, molybdenum chloride 0.5%, Textile dye mix II, 3-(Dimethylamino)-1-propylamine, Formaldehyde, Lauryl polyglucose, Methylisothiazolinone + Methylchloroisothiazolinone  Will send product safety list to email on file: [email or web address]  Danita Brain, MD [facility]

## 2023-08-07 NOTE — Patient Instructions (Addendum)
 Allergic contact dermatitis The patient has been provided detailed information regarding the substances she is sensitive to, as well as products containing the substances.  Meticulous avoidance of these substances is recommended. If avoidance is not possible, the use of barrier creams or lotions is recommended. Complete patch testing (NAC80 + metals) results are as follows: Positive test to nickel sulfate, molybdenum chloride 0.5%, Textile dye mix II, Methylisothiazolinone + Methylchloroisothiazolinone,  3-(Dimethylamino)-1-propylamine, Formaldehyde, Lauryl polyglucose  Will send product safety list to email on file: [email or web address]

## 2023-08-09 NOTE — Anesthesia Preprocedure Evaluation (Signed)
 Anesthesia Evaluation  Patient identified by MRN, date of birth, ID band Patient awake    Reviewed: Allergy & Precautions, NPO status , Patient's Chart, lab work & pertinent test results  Airway Mallampati: III  TM Distance: >3 FB Neck ROM: Full    Dental no notable dental hx. (+) Teeth Intact, Dental Advisory Given   Pulmonary asthma    Pulmonary exam normal breath sounds clear to auscultation       Cardiovascular negative cardio ROS Normal cardiovascular exam Rhythm:Regular Rate:Normal     Neuro/Psych  Headaches PSYCHIATRIC DISORDERS Anxiety Depression    TIACVA, No Residual Symptoms    GI/Hepatic Neg liver ROS,GERD  ,,  Endo/Other  diabetes (prediabetes), Oral Hypoglycemic Agents    Renal/GU negative Renal ROS  negative genitourinary   Musculoskeletal  (+) Arthritis ,    Abdominal   Peds  Hematology negative hematology ROS (+)   Anesthesia Other Findings   Reproductive/Obstetrics                              Anesthesia Physical Anesthesia Plan  ASA: 3  Anesthesia Plan: Spinal   Post-op Pain Management: Regional block* and Ofirmev  IV (intra-op)*   Induction:   PONV Risk Score and Plan: 2 and Treatment may vary due to age or medical condition, Midazolam , Dexamethasone , Propofol  infusion and Ondansetron   Airway Management Planned: Natural Airway  Additional Equipment:   Intra-op Plan:   Post-operative Plan:   Informed Consent: I have reviewed the patients History and Physical, chart, labs and discussed the procedure including the risks, benefits and alternatives for the proposed anesthesia with the patient or authorized representative who has indicated his/her understanding and acceptance.     Dental advisory given  Plan Discussed with: CRNA  Anesthesia Plan Comments:          Anesthesia Quick Evaluation

## 2023-08-10 ENCOUNTER — Encounter (HOSPITAL_COMMUNITY): Payer: Self-pay | Admitting: Orthopedic Surgery

## 2023-08-10 ENCOUNTER — Other Ambulatory Visit: Payer: Self-pay

## 2023-08-10 ENCOUNTER — Ambulatory Visit (HOSPITAL_COMMUNITY)
Admission: RE | Admit: 2023-08-10 | Discharge: 2023-08-10 | Disposition: A | Discharge status: Home / Self Care | Attending: Orthopedic Surgery | Admitting: Orthopedic Surgery

## 2023-08-10 ENCOUNTER — Encounter (HOSPITAL_COMMUNITY): Payer: Self-pay | Admitting: Anesthesiology

## 2023-08-10 ENCOUNTER — Encounter (HOSPITAL_COMMUNITY)
Admission: RE | Disposition: A | Discharge status: Home / Self Care | Payer: Self-pay | Source: Home / Self Care | Attending: Orthopedic Surgery

## 2023-08-10 ENCOUNTER — Ambulatory Visit (HOSPITAL_COMMUNITY): Payer: Self-pay | Admitting: Anesthesiology

## 2023-08-10 DIAGNOSIS — R7303 Prediabetes: Secondary | ICD-10-CM | POA: Insufficient documentation

## 2023-08-10 DIAGNOSIS — Z7984 Long term (current) use of oral hypoglycemic drugs: Secondary | ICD-10-CM | POA: Diagnosis not present

## 2023-08-10 DIAGNOSIS — M1711 Unilateral primary osteoarthritis, right knee: Secondary | ICD-10-CM | POA: Diagnosis present

## 2023-08-10 DIAGNOSIS — K219 Gastro-esophageal reflux disease without esophagitis: Secondary | ICD-10-CM | POA: Diagnosis not present

## 2023-08-10 DIAGNOSIS — F418 Other specified anxiety disorders: Secondary | ICD-10-CM

## 2023-08-10 DIAGNOSIS — J45909 Unspecified asthma, uncomplicated: Secondary | ICD-10-CM | POA: Insufficient documentation

## 2023-08-10 DIAGNOSIS — Z8673 Personal history of transient ischemic attack (TIA), and cerebral infarction without residual deficits: Secondary | ICD-10-CM | POA: Diagnosis not present

## 2023-08-10 HISTORY — PX: TOTAL KNEE ARTHROPLASTY: SHX125

## 2023-08-10 LAB — GLUCOSE, CAPILLARY
Glucose-Capillary: 104 mg/dL — ABNORMAL HIGH (ref 70–99)
Glucose-Capillary: 111 mg/dL — ABNORMAL HIGH (ref 70–99)

## 2023-08-10 LAB — POCT PREGNANCY, URINE: Preg Test, Ur: NEGATIVE

## 2023-08-10 SURGERY — ARTHROPLASTY, KNEE, TOTAL
Anesthesia: Spinal | Site: Knee | Laterality: Right

## 2023-08-10 MED ORDER — TRANEXAMIC ACID-NACL 1000-0.7 MG/100ML-% IV SOLN
1000.0000 mg | INTRAVENOUS | Status: AC
  Administered 2023-08-10: 1000 mg via INTRAVENOUS
  Filled 2023-08-10: qty 100

## 2023-08-10 MED ORDER — LIDOCAINE HCL (PF) 2 % IJ SOLN
INTRAMUSCULAR | Status: AC
  Filled 2023-08-10: qty 5

## 2023-08-10 MED ORDER — PHENYLEPHRINE 80 MCG/ML (10ML) SYRINGE FOR IV PUSH (FOR BLOOD PRESSURE SUPPORT)
PREFILLED_SYRINGE | INTRAVENOUS | Status: AC
  Filled 2023-08-10: qty 10

## 2023-08-10 MED ORDER — TRAMADOL HCL 50 MG PO TABS: 50.0000 mg | ORAL_TABLET | Freq: Four times a day (QID) | ORAL | 0 refills | Status: DC | PRN

## 2023-08-10 MED ORDER — FENTANYL CITRATE PF 50 MCG/ML IJ SOSY
PREFILLED_SYRINGE | INTRAMUSCULAR | Status: AC
  Filled 2023-08-10: qty 1

## 2023-08-10 MED ORDER — OXYCODONE HCL 5 MG PO TABS
ORAL_TABLET | ORAL | Status: AC
Start: 2023-08-10 — End: 2023-08-10
  Filled 2023-08-10: qty 1

## 2023-08-10 MED ORDER — DEXAMETHASONE SODIUM PHOSPHATE 10 MG/ML IJ SOLN
8.0000 mg | Freq: Once | INTRAMUSCULAR | Status: AC
  Administered 2023-08-10: 10 mg via INTRAVENOUS

## 2023-08-10 MED ORDER — METHOCARBAMOL 500 MG PO TABS
ORAL_TABLET | ORAL | Status: AC
Start: 2023-08-10 — End: 2023-08-10
  Filled 2023-08-10: qty 1

## 2023-08-10 MED ORDER — ASPIRIN 81 MG PO TBEC: 81.0000 mg | DELAYED_RELEASE_TABLET | ORAL | 2 refills | Status: DC

## 2023-08-10 MED ORDER — BUPIVACAINE LIPOSOME 1.3 % IJ SUSP: 20.0000 mL | Freq: Once | INTRAMUSCULAR | Status: DC

## 2023-08-10 MED ORDER — OXYCODONE HCL 5 MG PO TABS
5.0000 mg | ORAL_TABLET | ORAL | Status: AC | PRN
  Administered 2023-08-10: 5 mg via ORAL

## 2023-08-10 MED ORDER — MIDAZOLAM HCL 2 MG/2ML IJ SOLN
INTRAMUSCULAR | Status: AC
  Filled 2023-08-10: qty 2

## 2023-08-10 MED ORDER — PROPOFOL 1000 MG/100ML IV EMUL
INTRAVENOUS | Status: AC
  Filled 2023-08-10: qty 100

## 2023-08-10 MED ORDER — ONDANSETRON HCL 4 MG/2ML IJ SOLN
INTRAMUSCULAR | Status: AC
  Filled 2023-08-10: qty 2

## 2023-08-10 MED ORDER — PROPOFOL 500 MG/50ML IV EMUL
INTRAVENOUS | Status: DC | PRN
Start: 2023-08-10 — End: 2023-08-10
  Administered 2023-08-10: 120 ug/kg/min via INTRAVENOUS

## 2023-08-10 MED ORDER — ACETAMINOPHEN 10 MG/ML IV SOLN
1000.0000 mg | Freq: Four times a day (QID) | INTRAVENOUS | Status: DC
  Administered 2023-08-10: 1000 mg via INTRAVENOUS
  Filled 2023-08-10: qty 100

## 2023-08-10 MED ORDER — CYCLOBENZAPRINE HCL 10 MG PO TABS: 10.0000 mg | ORAL_TABLET | Freq: Three times a day (TID) | ORAL | 0 refills | Status: DC | PRN

## 2023-08-10 MED ORDER — OXYCODONE HCL 5 MG PO TABS
ORAL_TABLET | ORAL | Status: AC
  Filled 2023-08-10: qty 1

## 2023-08-10 MED ORDER — PROPOFOL 500 MG/50ML IV EMUL
INTRAVENOUS | Status: AC
  Filled 2023-08-10: qty 50

## 2023-08-10 MED ORDER — INSULIN ASPART 100 UNIT/ML IJ SOLN: 0.0000 [IU] | INTRAMUSCULAR | Status: DC | PRN

## 2023-08-10 MED ORDER — LACTATED RINGERS IV SOLN: INTRAVENOUS | Status: DC

## 2023-08-10 MED ORDER — AMISULPRIDE (ANTIEMETIC) 5 MG/2ML IV SOLN: 10.0000 mg | Freq: Once | INTRAVENOUS | Status: DC | PRN

## 2023-08-10 MED ORDER — BUPIVACAINE LIPOSOME 1.3 % IJ SUSP
INTRAMUSCULAR | Status: AC
  Filled 2023-08-10: qty 20

## 2023-08-10 MED ORDER — LACTATED RINGERS IV SOLN: INTRAVENOUS | Status: DC | PRN

## 2023-08-10 MED ORDER — SODIUM CHLORIDE 0.9 % IR SOLN
Status: DC | PRN
  Administered 2023-08-10: 1000 mL

## 2023-08-10 MED ORDER — ROPIVACAINE HCL 5 MG/ML IJ SOLN
INTRAMUSCULAR | Status: DC | PRN
Start: 2023-08-10 — End: 2023-08-10
  Administered 2023-08-10: 20 mL via PERINEURAL

## 2023-08-10 MED ORDER — HYDROMORPHONE HCL 1 MG/ML IJ SOLN: 0.5000 mg | INTRAMUSCULAR | Status: DC | PRN

## 2023-08-10 MED ORDER — FENTANYL CITRATE PF 50 MCG/ML IJ SOSY
25.0000 ug | PREFILLED_SYRINGE | INTRAMUSCULAR | Status: DC | PRN
  Administered 2023-08-10 (×2): 50 ug via INTRAVENOUS

## 2023-08-10 MED ORDER — FENTANYL CITRATE (PF) 100 MCG/2ML IJ SOLN
INTRAMUSCULAR | Status: DC | PRN
  Administered 2023-08-10 (×2): 50 ug via INTRAVENOUS

## 2023-08-10 MED ORDER — OXYCODONE HCL 5 MG PO TABS: 5.0000 mg | ORAL_TABLET | Freq: Four times a day (QID) | ORAL | 0 refills | Status: DC | PRN

## 2023-08-10 MED ORDER — LACTATED RINGERS IV SOLN
INTRAVENOUS | Status: DC
Start: 2023-08-10 — End: 2023-08-10

## 2023-08-10 MED ORDER — PHENYLEPHRINE 80 MCG/ML (10ML) SYRINGE FOR IV PUSH (FOR BLOOD PRESSURE SUPPORT)
PREFILLED_SYRINGE | INTRAVENOUS | Status: DC | PRN
  Administered 2023-08-10 (×2): 80 ug via INTRAVENOUS
  Administered 2023-08-10 (×4): 160 ug via INTRAVENOUS

## 2023-08-10 MED ORDER — ONDANSETRON HCL 4 MG/2ML IJ SOLN
INTRAMUSCULAR | Status: DC | PRN
  Administered 2023-08-10: 4 mg via INTRAVENOUS

## 2023-08-10 MED ORDER — FENTANYL CITRATE (PF) 100 MCG/2ML IJ SOLN
INTRAMUSCULAR | Status: AC
  Filled 2023-08-10: qty 2

## 2023-08-10 MED ORDER — HYDROMORPHONE HCL 1 MG/ML IJ SOLN
0.2500 mg | INTRAMUSCULAR | Status: DC | PRN
  Administered 2023-08-10 (×2): 0.5 mg via INTRAVENOUS

## 2023-08-10 MED ORDER — MIDAZOLAM HCL 5 MG/5ML IJ SOLN
INTRAMUSCULAR | Status: DC | PRN
  Administered 2023-08-10: 2 mg via INTRAVENOUS

## 2023-08-10 MED ORDER — CHLORHEXIDINE GLUCONATE 0.12 % MT SOLN
15.0000 mL | Freq: Once | OROMUCOSAL | Status: AC
  Administered 2023-08-10: 15 mL via OROMUCOSAL

## 2023-08-10 MED ORDER — OXYCODONE HCL 5 MG PO TABS
5.0000 mg | ORAL_TABLET | ORAL | Status: DC | PRN
  Administered 2023-08-10: 5 mg via ORAL

## 2023-08-10 MED ORDER — TRAMADOL HCL 50 MG PO TABS: 50.0000 mg | ORAL_TABLET | Freq: Four times a day (QID) | ORAL | Status: DC | PRN

## 2023-08-10 MED ORDER — HYDROMORPHONE HCL 1 MG/ML IJ SOLN
INTRAMUSCULAR | Status: AC
  Filled 2023-08-10: qty 1

## 2023-08-10 MED ORDER — BUPIVACAINE LIPOSOME 1.3 % IJ SUSP
INTRAMUSCULAR | Status: DC | PRN
  Administered 2023-08-10: 80 mL

## 2023-08-10 MED ORDER — METHOCARBAMOL 500 MG PO TABS: 500.0000 mg | ORAL_TABLET | Freq: Once | ORAL | Status: DC

## 2023-08-10 MED ORDER — PROPOFOL 10 MG/ML IV BOLUS
INTRAVENOUS | Status: DC | PRN
  Administered 2023-08-10: 30 mg via INTRAVENOUS
  Administered 2023-08-10: 50 mg via INTRAVENOUS
  Administered 2023-08-10: 20 mg via INTRAVENOUS
  Administered 2023-08-10: 50 mg via INTRAVENOUS

## 2023-08-10 MED ORDER — PROMETHAZINE HCL 12.5 MG PO TABS: 12.5000 mg | ORAL_TABLET | Freq: Four times a day (QID) | ORAL | 0 refills | Status: DC | PRN

## 2023-08-10 MED ORDER — SODIUM CHLORIDE (PF) 0.9 % IJ SOLN
INTRAMUSCULAR | Status: AC
  Filled 2023-08-10: qty 50

## 2023-08-10 MED ORDER — CEFAZOLIN SODIUM-DEXTROSE 2-4 GM/100ML-% IV SOLN: 2.0000 g | Freq: Four times a day (QID) | INTRAVENOUS | Status: DC

## 2023-08-10 MED ORDER — DEXAMETHASONE SODIUM PHOSPHATE 10 MG/ML IJ SOLN
INTRAMUSCULAR | Status: DC | PRN
  Administered 2023-08-10: 10 mg

## 2023-08-10 MED ORDER — SODIUM CHLORIDE (PF) 0.9 % IJ SOLN
INTRAMUSCULAR | Status: AC
  Filled 2023-08-10: qty 10

## 2023-08-10 MED ORDER — POVIDONE-IODINE 10 % EX SWAB: 2.0000 | Freq: Once | CUTANEOUS | Status: DC

## 2023-08-10 MED ORDER — BUPIVACAINE IN DEXTROSE 0.75-8.25 % IT SOLN
INTRATHECAL | Status: DC | PRN
  Administered 2023-08-10: 1.6 mL via INTRATHECAL

## 2023-08-10 MED ORDER — 0.9 % SODIUM CHLORIDE (POUR BTL) OPTIME
TOPICAL | Status: DC | PRN
  Administered 2023-08-10: 1000 mL

## 2023-08-10 MED ORDER — DEXAMETHASONE SODIUM PHOSPHATE 10 MG/ML IJ SOLN
INTRAMUSCULAR | Status: AC
  Filled 2023-08-10: qty 1

## 2023-08-10 MED ORDER — CEFAZOLIN SODIUM-DEXTROSE 2-4 GM/100ML-% IV SOLN
2.0000 g | INTRAVENOUS | Status: AC
  Administered 2023-08-10: 2 g via INTRAVENOUS
  Filled 2023-08-10: qty 100

## 2023-08-10 MED ORDER — ORAL CARE MOUTH RINSE: 15.0000 mL | Freq: Once | OROMUCOSAL | Status: AC

## 2023-08-10 SURGICAL SUPPLY — 50 items
BAG COUNTER SPONGE SURGICOUNT (BAG) IMPLANT
BAG ZIPLOCK 12X15 (MISCELLANEOUS) ×1 IMPLANT
BLADE SAG 18X100X1.27 (BLADE) ×1 IMPLANT
BLADE SAW SGTL 11.0X1.19X90.0M (BLADE) ×1 IMPLANT
BNDG ELASTIC 6INX 5YD STR LF (GAUZE/BANDAGES/DRESSINGS) ×1 IMPLANT
BOWL SMART MIX CTS (DISPOSABLE) ×1 IMPLANT
CEMENT HV SMART SET (Cement) ×2 IMPLANT
COMPONENT FEM CMT PS STD 9 RT (Joint) IMPLANT
COVER SURGICAL LIGHT HANDLE (MISCELLANEOUS) ×1 IMPLANT
CUFF TRNQT CYL 34X4.125X (TOURNIQUET CUFF) ×1 IMPLANT
DERMABOND ADVANCED .7 DNX12 (GAUZE/BANDAGES/DRESSINGS) ×1 IMPLANT
DRAPE U-SHAPE 47X51 STRL (DRAPES) ×1 IMPLANT
DRSG AQUACEL AG ADV 3.5X10 (GAUZE/BANDAGES/DRESSINGS) ×1 IMPLANT
DRSG EMULSION OIL 3X16 NADH (GAUZE/BANDAGES/DRESSINGS) IMPLANT
DURAPREP 26ML APPLICATOR (WOUND CARE) ×1 IMPLANT
ELECT REM PT RETURN 15FT ADLT (MISCELLANEOUS) ×1 IMPLANT
GAUZE PAD ABD 8X10 STRL (GAUZE/BANDAGES/DRESSINGS) IMPLANT
GAUZE SPONGE 4X4 12PLY STRL (GAUZE/BANDAGES/DRESSINGS) IMPLANT
GLOVE BIO SURGEON STRL SZ 6.5 (GLOVE) IMPLANT
GLOVE BIO SURGEON STRL SZ7 (GLOVE) IMPLANT
GLOVE BIO SURGEON STRL SZ8 (GLOVE) ×1 IMPLANT
GLOVE BIOGEL PI IND STRL 7.0 (GLOVE) ×1 IMPLANT
GLOVE BIOGEL PI IND STRL 8 (GLOVE) ×1 IMPLANT
GOWN STRL REUS W/ TWL LRG LVL3 (GOWN DISPOSABLE) ×1 IMPLANT
HOLDER FOLEY CATH W/STRAP (MISCELLANEOUS) ×1 IMPLANT
IMMOBILIZER KNEE 20 THIGH 36 (SOFTGOODS) ×1 IMPLANT
IMMOBILIZER KNEE 22 UNIV (SOFTGOODS) IMPLANT
INSERT TIB FIXED RT 11 (Insert) IMPLANT
KIT TURNOVER KIT A (KITS) ×1 IMPLANT
MANIFOLD NEPTUNE II (INSTRUMENTS) ×1 IMPLANT
NS IRRIG 1000ML POUR BTL (IV SOLUTION) ×1 IMPLANT
PACK TOTAL KNEE CUSTOM (KITS) ×1 IMPLANT
PADDING CAST ABS COTTON 6X4 NS (CAST SUPPLIES) IMPLANT
PADDING CAST COTTON 6X4 STRL (CAST SUPPLIES) ×2 IMPLANT
PENCIL SMOKE EVACUATOR (MISCELLANEOUS) ×1 IMPLANT
PIN DRILL HDLS TROCAR 75 4PK (PIN) IMPLANT
PROTECTOR NERVE ULNAR (MISCELLANEOUS) ×1 IMPLANT
SCREW FEMALE HEX FIX 25X2.5 (ORTHOPEDIC DISPOSABLE SUPPLIES) IMPLANT
SET HNDPC FAN SPRY TIP SCT (DISPOSABLE) ×1 IMPLANT
STEM POLY PAT PLY 38M KNEE (Knees) IMPLANT
STEM TIBIA 5 DEG SZ E R KNEE (Knees) IMPLANT
SUT MNCRL AB 4-0 PS2 18 (SUTURE) ×1 IMPLANT
SUT VIC AB 2-0 CT1 TAPERPNT 27 (SUTURE) ×3 IMPLANT
SUTURE STRATFX 0 PDS 27 VIOLET (SUTURE) ×1 IMPLANT
TOWEL GREEN STERILE FF (TOWEL DISPOSABLE) ×1 IMPLANT
TRAY FOLEY MTR SLVR 14FR STAT (SET/KITS/TRAYS/PACK) IMPLANT
TRAY FOLEY MTR SLVR 16FR STAT (SET/KITS/TRAYS/PACK) ×1 IMPLANT
TUBE SUCTION HIGH CAP CLEAR NV (SUCTIONS) ×1 IMPLANT
WATER STERILE IRR 1000ML POUR (IV SOLUTION) ×2 IMPLANT
WRAP KNEE MAXI GEL POST OP (GAUZE/BANDAGES/DRESSINGS) ×1 IMPLANT

## 2023-08-10 NOTE — Addendum Note (Signed)
 Addended by: MARINDA SHERROD CROME on: 08/10/2023 05:34 PM   Modules accepted: Orders

## 2023-08-10 NOTE — Op Note (Signed)
 OPERATIVE REPORT-TOTAL KNEE ARTHROPLASTY   Pre-operative diagnosis- Osteoarthritis  Right knee(s)  Post-operative diagnosis- Osteoarthritis Right knee(s)  Procedure-  Right  Total Knee Arthroplasty ([employer] titanium knee)  Surgeon- Dempsey GAILS. Mika Griffitts, MD  Assistant- Zelda Kobs, PA-C   Anesthesia-  Adductor canal block and spinal  EBL- 25 ml   Drains None  Tourniquet time-  Total Tourniquet Time Documented: Thigh (Right) - 42 minutes Total: Thigh (Right) - 42 minutes     Complications- None  Condition-PACU - hemodynamically stable.   Brief Clinical Note  Rebecca Dougherty is a 53 y.o. year old female with end stage OA of her right knee with progressively worsening pain and dysfunction. She has constant pain, with activity and at rest and significant functional deficits with difficulties even with ADLs. She has had extensive non-op management including analgesics, injections of cortisone and viscosupplements, and home exercise program, but remains in significant pain with significant dysfunction.Radiographs show bone on bone arthritis medial and patellofemoral. She presents now for right Total Knee Arthroplasty.     Procedure in detail---   The patient is brought into the operating room and positioned supine on the operating table. After successful administration of  Adductor canal block and spinal,   a tourniquet is placed high on the  Right thigh(s) and the lower extremity is prepped and draped in the usual sterile fashion. Time out is performed by the operating team and then the  Right lower extremity is wrapped in Esmarch, knee flexed and the tourniquet inflated to 300 mmHg.       A midline incision is made with a ten blade through the subcutaneous tissue to the level of the extensor mechanism. A fresh blade is used to make a medial parapatellar arthrotomy. Soft tissue over the proximal medial tibia is subperiosteally elevated to the joint line with a knife and into  the semimembranosus bursa with a Cobb elevator. Soft tissue over the proximal lateral tibia is elevated with attention being paid to avoiding the patellar tendon on the tibial tubercle. The patella is everted, knee flexed 90 degrees and the ACL and PCL are removed. Findings are bone on bone medial and patellofemoral with large global osteophytes        The drill is used to create a starting hole in the distal femur and the canal is thoroughly irrigated with sterile saline to remove the fatty contents. The 5 degree Right  valgus alignment guide is placed into the femoral canal and the distal femoral cutting block is pinned to remove 10 mm off the distal femur. Resection is made with an oscillating saw.      The tibia is subluxed forward and the menisci are removed. The extramedullary alignment guide is placed referencing proximally at the medial aspect of the tibial tubercle and distally along the second metatarsal axis and tibial crest. The block is pinned to remove 2mm off the more deficient medial  side. Resection is made with an oscillating saw. Size E is the most appropriate size for the tibia and the proximal tibia is prepared with the modular drill and keel punch for that size.      The femoral sizing guide is placed and size 9 is most appropriate. Rotation is marked off the epicondylar axis and confirmed by creating a rectangular flexion gap at 90 degrees. The size 9 cutting block is pinned in this rotation and the anterior, posterior and chamfer cuts are made with the oscillating saw. The intercondylar block is then placed and  that cut is made.      Trial size E tibial component, trial size 9 posterior stabilized femur and a 12  mm posterior stabilized fixed bearing insert trial is placed. Full extension is achieved with excellent varus/valgus and anterior/posterior balance throughout full range of motion. The patella is everted and thickness measured to be 24  mm. Free hand resection is taken to 14 mm,  a 38 template is placed, lug holes are drilled, trial patella is placed, and it tracks normally. Osteophytes are removed off the posterior femur with the trial in place. All trials are removed and the cut bone surfaces prepared with pulsatile lavage. Cement is mixed and once ready for implantation, the size E tibial implant, size  9 posterior stabilized femoral component, and the size 38 patella are cemented in place and the patella is held with the clamp. The trial insert is placed and the knee held in full extension. The Exparel  (20 ml mixed with 60 ml saline) is injected into the extensor mechanism, posterior capsule, medial and lateral gutters and subcutaneous tissues.  All extruded cement is removed and once the cement is hard the permanent 12 mm posterior stabilized fixed bearing insert is placed into the tibial tray.      The wound is copiously irrigated with saline solution and the extensor mechanism closed with # 0 Stratofix suture. The tourniquet is released for a total tourniquet time of 42  minutes. Flexion against gravity is 140 degrees and the patella tracks normally. Subcutaneous tissue is closed with 2.0 vicryl and subcuticular with running 4.0 Monocryl. The incision is cleaned and dried and steri-strips and a bulky sterile dressing are applied. The limb is placed into a knee immobilizer and the patient is awakened and transported to recovery in stable condition.      Please note that a surgical assistant was a medical necessity for this procedure in order to perform it in a safe and expeditious manner. Surgical assistant was necessary to retract the ligaments and vital neurovascular structures to prevent injury to them and also necessary for proper positioning of the limb to allow for anatomic placement of the prosthesis.   Dempsey ROCKFORD Evanna Washinton, MD    08/10/2023, 8:45 AM

## 2023-08-10 NOTE — Anesthesia Postprocedure Evaluation (Signed)
 Anesthesia Post Note  Patient: Rebecca Dougherty  Procedure(s) Performed: ARTHROPLASTY, KNEE, TOTAL (Right: Knee)     Patient location during evaluation: PACU Anesthesia Type: Spinal and Regional Level of consciousness: oriented and awake and alert Pain management: pain level controlled Vital Signs Assessment: post-procedure vital signs reviewed and stable Respiratory status: spontaneous breathing, respiratory function stable and patient connected to nasal cannula oxygen Cardiovascular status: blood pressure returned to baseline and stable Postop Assessment: no headache, no backache and no apparent nausea or vomiting Anesthetic complications: no   No notable events documented.  Last Vitals:  Vitals:   08/10/23 1145 08/10/23 1200  BP: 115/78 107/89  Pulse: 72 76  Resp:    Temp:    SpO2: 92% 96%    Last Pain:  Vitals:   08/10/23 1210  TempSrc:   PainSc: 7                  Dao Mearns L Maci Eickholt

## 2023-08-10 NOTE — Evaluation (Signed)
 Physical Therapy Evaluation Patient Details Name: Rebecca Dougherty MRN: [id number] DOB: 12-Aug-1970 Today's Date: 08/10/2023  History of Present Illness  53 yo female presents to therapy s/p R TKA on 08/10/2023 due to failure of conservative measures. Pt PMH includes but is not limited to: R ankle fx, arthritis, anxiety, asthma, HA, depression, hypotension, TIA, and several knee surgeries.  Clinical Impression    Rebecca Dougherty is a 53 y.o.  female POD 0 s/p R TKA. Patient reports IND with mobility at baseline. Patient is now limited by functional impairments (see PT problem list below) and requires CGA and cues for transfers and gait with RW. Patient was able to ambulate 55 feet with RW and CGA and cues for safe walker management. Patient educated on safe sequencing for stair mobility with R handrail and SPC, fall risk prevention, use of CP/ice, pain management and goal and car transfers pt and spouse verbalized understanding of safe guarding position for people assisting with mobility. Patient instructed in exercises to facilitate ROM and circulation reviewed and HO provided. Patient will benefit from continued skilled PT interventions to address impairments and progress towards PLOF. Patient has met mobility goals at adequate level for discharge home with family support and OPPT services scheduled for 7/24; will continue to follow if pt continues acute stay to progress towards Mod I goals.       If plan is discharge home, recommend the following: A little help with walking and/or transfers;A little help with bathing/dressing/bathroom;Assistance with cooking/housework;Assist for transportation;Help with stairs or ramp for entrance   Can travel by private vehicle        Equipment Recommendations None recommended by PT  Recommendations for Other Services       Functional Status Assessment Patient has had a recent decline in their functional status and demonstrates the ability to make  significant improvements in function in a reasonable and predictable amount of time.     Precautions / Restrictions Precautions Precautions: Knee;Fall Restrictions Weight Bearing Restrictions Per Provider Order: No      Mobility  Bed Mobility Overal bed mobility: Needs Assistance Bed Mobility: Sit to Supine       Sit to supine: Supervision, HOB elevated   General bed mobility comments: min cues    Transfers Overall transfer level: Needs assistance Equipment used: Rolling walker (2 wheels) Transfers: Sit to/from Stand Sit to Stand: Contact guard assist           General transfer comment: min cues    Ambulation/Gait Ambulation/Gait assistance: Contact guard assist Gait Distance (Feet): 55 Feet Assistive device: Rolling walker (2 wheels) Gait Pattern/deviations: Step-to pattern, Decreased stance time - right, Antalgic, Trunk flexed Gait velocity: decreased     General Gait Details: slight trunk flexion with B UE support at RW to offload R LE in stance phase, min cues for posture, safety and RW mangement  Stairs Stairs: Yes Stairs assistance: Contact guard assist Stair Management: With cane, One rail Right, Two rails Number of Stairs: 5 (x2) General stair comments: step navigation instruction initiated with B handrail with cues for safety, technique and sequencing with step to pattern and then pt able to progress to CGA with SPC and R handrail  Wheelchair Mobility     Tilt Bed    Modified Rankin (Stroke Patients Only)       Balance Overall balance assessment: Needs assistance Sitting-balance support: Feet supported Sitting balance-Leahy Scale: Good     Standing balance support: Bilateral upper extremity supported, During functional activity, Reliant on  assistive device for balance Standing balance-Leahy Scale: Fair                               Pertinent Vitals/Pain Pain Assessment Pain Assessment: 0-10 Pain Score: 5  Pain Location:  R LE and knee Pain Descriptors / Indicators: Aching, Contraction, Cramping, Discomfort, Grimacing, Operative site guarding, Burning Pain Intervention(s): Limited activity within patient's tolerance, Monitored during session, Premedicated before session, Repositioned, Ice applied    Home Living Family/patient expects to be discharged to:: Private residence Living Arrangements: Spouse/significant other Available Help at Discharge: Family Type of Home: House Home Access: Stairs to enter Entrance Stairs-Rails: Right Entrance Stairs-Number of Steps: 14 Alternate Level Stairs-Number of Steps: flight with landing Home Layout: Laundry or work area in basement;Multi-level Home Equipment: Agricultural consultant (2 wheels);Cane - single point;Shower seat (knee scooter) Additional Comments: car cane    Prior Function Prior Level of Function : Independent/Modified Independent;Working/employed             Mobility Comments: IND no AD for all ADLs self care tasks and IADLs       Extremity/Trunk Assessment        Lower Extremity Assessment Lower Extremity Assessment: RLE deficits/detail RLE Deficits / Details: ankle DF/PF 5/5; SLR < 10 degree lag RLE Sensation: decreased light touch;decreased proprioception (B heels once in standing and buttocks)    Cervical / Trunk Assessment Cervical / Trunk Assessment: Normal  Communication   Communication Communication: No apparent difficulties    Cognition Arousal: Alert Behavior During Therapy: WFL for tasks assessed/performed   PT - Cognitive impairments: No apparent impairments                         Following commands: Intact       Cueing       General Comments      Exercises Total Joint Exercises Ankle Circles/Pumps: AROM, Both, 10 reps Quad Sets: AROM, Right, 5 reps Short Arc Quad: AROM, Right, 5 reps Heel Slides: AROM, Right, 5 reps Hip ABduction/ADduction: AROM, Right, 5 reps Straight Leg Raises: AROM, Right, 5  reps Knee Flexion: AROM, Right, 5 reps   Assessment/Plan    PT Assessment Patient needs continued PT services  PT Problem List Decreased strength;Decreased range of motion;Decreased activity tolerance;Decreased balance;Decreased coordination;Decreased mobility;Pain       PT Treatment Interventions DME instruction;Gait training;Stair training;Functional mobility training;Therapeutic activities;Therapeutic exercise;Balance training;Neuromuscular re-education;Patient/family education;Modalities    PT Goals (Current goals can be found in the Care Plan section)  Acute Rehab PT Goals Patient Stated Goal: to be able to squat and no pain with sitting and standing PT Goal Formulation: With patient Time For Goal Achievement: 08/24/23 Potential to Achieve Goals: Good    Frequency 7X/week     Co-evaluation               AM-PAC PT 6 Clicks Mobility  Outcome Measure Help needed turning from your back to your side while in a flat bed without using bedrails?: None Help needed moving from lying on your back to sitting on the side of a flat bed without using bedrails?: A Little Help needed moving to and from a bed to a chair (including a wheelchair)?: A Little Help needed standing up from a chair using your arms (e.g., wheelchair or bedside chair)?: A Little Help needed to walk in hospital room?: A Little Help needed climbing 3-5 steps with a railing? :  A Little 6 Click Score: 19    End of Session Equipment Utilized During Treatment: Gait belt Activity Tolerance: No increased pain;Patient tolerated treatment well Patient left: in chair;with call bell/phone within reach;with family/visitor present Nurse Communication: Mobility status;Other (comment) (pt readiness for d/c from PT standpoint) PT Visit Diagnosis: Unsteadiness on feet (R26.81);Other abnormalities of gait and mobility (R26.89);Muscle weakness (generalized) (M62.81);Difficulty in walking, not elsewhere classified  (R26.2);Pain Pain - Right/Left: Right Pain - part of body: Knee;Leg    Time: 8776-8691 PT Time Calculation (min) (ACUTE ONLY): 45 min   Charges:   PT Evaluation $PT Eval Low Complexity: 1 Low PT Treatments $Gait Training: 8-22 mins $Therapeutic Exercise: 8-22 mins PT General Charges $$ ACUTE PT VISIT: 1 Visit         [place], PT Acute Rehab   [place] VEAR Drone 08/10/2023, 1:16 PM

## 2023-08-10 NOTE — Anesthesia Procedure Notes (Signed)
 Spinal  Patient location during procedure: OR Start time: 08/10/2023 7:34 AM End time: 08/10/2023 7:36 AM Reason for block: surgical anesthesia Staffing Performed: anesthesiologist  Anesthesiologist: Niels Marien CROME, MD Performed by: Niels Marien CROME, MD Authorized by: Niels Marien CROME, MD   Preanesthetic Checklist Completed: patient identified, IV checked, risks and benefits discussed, surgical consent, monitors and equipment checked, pre-op evaluation and timeout performed Spinal Block Patient position: sitting Prep: DuraPrep and site prepped and draped Patient monitoring: cardiac monitor, continuous pulse ox and blood pressure Approach: midline Location: L3-4 Injection technique: single-shot Needle Needle type: Pencan  Needle gauge: 24 G Needle length: 9 cm Assessment Sensory level: T6 Events: CSF return Additional Notes Functioning IV was confirmed and monitors were applied. Sterile prep and drape, including hand hygiene and sterile gloves were used. The patient was positioned and the spine was prepped. The skin was anesthetized with lidocaine .  Free flow of clear CSF was obtained prior to injecting local anesthetic into the CSF.  The spinal needle aspirated freely following injection.  The needle was carefully withdrawn.  The patient tolerated the procedure well.

## 2023-08-10 NOTE — Transfer of Care (Signed)
 Immediate Anesthesia Transfer of Care Note  Patient: Rebecca Dougherty  Procedure(s) Performed: ARTHROPLASTY, KNEE, TOTAL (Right: Knee)  Patient Location: PACU  Anesthesia Type:MAC, Regional, and Spinal  Level of Consciousness: drowsy  Airway & Oxygen Therapy: Patient Spontanous Breathing and Patient connected to face mask oxygen  Post-op Assessment: Report given to RN and Post -op Vital signs reviewed and stable  Post vital signs: Reviewed and stable  Last Vitals:  Vitals Value Taken Time  BP 107/70 08/10/23 09:13  Temp    Pulse 67 08/10/23 09:15  Resp 20 08/10/23 09:15  SpO2 100 % 08/10/23 09:15  Vitals shown include unfiled device data.  Last Pain:  Vitals:   08/10/23 0608  TempSrc:   PainSc: 4          Complications: No notable events documented.

## 2023-08-10 NOTE — Anesthesia Procedure Notes (Addendum)
 Anesthesia Regional Block: Adductor canal block   Pre-Anesthetic Checklist: , timeout performed,  Correct Patient, Correct Site, Correct Laterality,  Correct Procedure, Correct Position, site marked,  Risks and benefits discussed,  Pre-op evaluation,  At surgeon's request and post-op pain management  Laterality: Right  Prep: Maximum Sterile Barrier Precautions used, chloraprep       Needles:  Injection technique: Single-shot  Needle Type: Echogenic Stimulator Needle     Needle Length: 9cm  Needle Gauge: 21     Additional Needles:   Procedures:,,,, ultrasound used (permanent image in chart),,    Narrative:  Start time: 08/10/2023 6:58 AM End time: 08/10/2023 7:01 AM Injection made incrementally with aspirations every 5 mL. Anesthesiologist: Niels Marien CROME, MD

## 2023-08-10 NOTE — Interval H&P Note (Signed)
 History and Physical Interval Note:  08/10/2023 6:30 AM  Rebecca Dougherty  has presented today for surgery, with the diagnosis of Right knee osteoarthritis.  The various methods of treatment have been discussed with the patient and family. After consideration of risks, benefits and other options for treatment, the patient has consented to  Procedure(s): ARTHROPLASTY, KNEE, TOTAL (Right) as a surgical intervention.  The patient's history has been reviewed, patient examined, no change in status, stable for surgery.  I have reviewed the patient's chart and labs.  Questions were answered to the patient's satisfaction.     Dempsey Malcomb Gangemi

## 2023-08-10 NOTE — Discharge Instructions (Addendum)
 Ollen Gross, MD Total Joint Specialist [facility] [address] [place], [place] [address] [phone]  TOTAL KNEE REPLACEMENT POSTOPERATIVE DIRECTIONS    Knee Rehabilitation, Guidelines Following Surgery  Results after knee surgery are often greatly improved when you follow the exercise, range of motion and muscle strengthening exercises prescribed by your doctor. Safety measures are also important to protect the knee from further injury. If any of these exercises cause you to have increased pain or swelling in your knee joint, decrease the amount until you are comfortable again and slowly increase them. If you have problems or questions, call your caregiver or physical therapist for advice.   BLOOD CLOT PREVENTION Take an 81 mg Aspirin two times a day for three weeks following surgery. Then take an 81 mg Aspirin once a day for three weeks. Then discontinue Aspirin. You may resume your vitamins/supplements upon discharge from the hospital. Do not take any NSAIDs (Advil, Aleve, Ibuprofen, Meloxicam, etc.) until you have discontinued the 81 mg Aspirin twice a day.  HOME CARE INSTRUCTIONS  Remove items at home which could result in a fall. This includes throw rugs or furniture in walking pathways.  ICE to the affected knee as much as tolerated. Icing helps control swelling. If the swelling is well controlled you will be more comfortable and rehab easier. Continue to use ice on the knee for pain and swelling from surgery. You may notice swelling that will progress down to the foot and ankle. This is normal after surgery. Elevate the leg when you are not up walking on it.    Continue to use the breathing machine which will help keep your temperature down. It is common for your temperature to cycle up and down following surgery, especially at night when you are not up moving around and exerting yourself. The breathing machine keeps your lungs expanded and your  temperature down. Do not place pillow under the operative knee, focus on keeping the knee straight while resting  DIET You may resume your previous home diet once you are discharged from the hospital.  DRESSING / WOUND CARE / SHOWERING Keep your bulky bandage on for 2 days. On the third post-operative day you may remove the Ace bandage and gauze. There is a waterproof adhesive bandage on your skin which will stay in place until your first follow-up appointment. Once you remove this you will not need to place another bandage You may begin showering 3 days following surgery, but do not submerge the incision under water.  ACTIVITY For the first 5 days, the key is rest and control of pain and swelling Do your home exercises twice a day starting on post-operative day 3. On the days you go to physical therapy, just do the home exercises once that day. You should rest, ice and elevate the leg for 50 minutes out of every hour. Get up and walk/stretch for 10 minutes per hour. After 5 days you can increase your activity slowly as tolerated. Walk with your walker as instructed. Use the walker until you are comfortable transitioning to a cane. Walk with the cane in the opposite hand of the operative leg. You may discontinue the cane once you are comfortable and walking steadily. Avoid periods of inactivity such as sitting longer than an hour when not asleep. This helps prevent blood clots.  You may discontinue the knee immobilizer once you are able to perform a straight leg raise while lying down. You may resume a sexual relationship  in one month or when given the OK by your doctor.  You may return to work once you are cleared by your doctor.  Do not drive a car for 6 weeks or until released by your surgeon.  Do not drive while taking narcotics.  TED HOSE STOCKINGS Wear the elastic stockings on both legs for three weeks following surgery during the day. You may remove them at night for sleeping.  WEIGHT  BEARING Weight bearing as tolerated with assist device (walker, cane, etc) as directed, use it as long as suggested by your surgeon or therapist, typically at least 4-6 weeks.  POSTOPERATIVE CONSTIPATION PROTOCOL Constipation - defined medically as fewer than three stools per week and severe constipation as less than one stool per week.  One of the most common issues patients have following surgery is constipation.  Even if you have a regular bowel pattern at home, your normal regimen is likely to be disrupted due to multiple reasons following surgery.  Combination of anesthesia, postoperative narcotics, change in appetite and fluid intake all can affect your bowels.  In order to avoid complications following surgery, here are some recommendations in order to help you during your recovery period.  Colace (docusate) - Pick up an over-the-counter form of Colace or another stool softener and take twice a day as long as you are requiring postoperative pain medications.  Take with a full glass of water daily.  If you experience loose stools or diarrhea, hold the colace until you stool forms back up. If your symptoms do not get better within 1 week or if they get worse, check with your doctor. Dulcolax (bisacodyl) - Pick up over-the-counter and take as directed by the product packaging as needed to assist with the movement of your bowels.  Take with a full glass of water.  Use this product as needed if not relieved by Colace only.  MiraLax (polyethylene glycol) - Pick up over-the-counter to have on hand. MiraLax is a solution that will increase the amount of water in your bowels to assist with bowel movements.  Take as directed and can mix with a glass of water, juice, soda, coffee, or tea. Take if you go more than two days without a movement. Do not use MiraLax more than once per day. Call your doctor if you are still constipated or irregular after using this medication for 7 days in a row.  If you continue  to have problems with postoperative constipation, please contact the office for further assistance and recommendations.  If you experience "the worst abdominal pain ever" or develop nausea or vomiting, please contact the office immediatly for further recommendations for treatment.  ITCHING If you experience itching with your medications, try taking only a single pain pill, or even half a pain pill at a time.  You can also use Benadryl over the counter for itching or also to help with sleep.   MEDICATIONS See your medication summary on the "After Visit Summary" that the nursing staff will review with you prior to discharge.  You may have some home medications which will be placed on hold until you complete the course of blood thinner medication.  It is important for you to complete the blood thinner medication as prescribed by your surgeon.  Continue your approved medications as instructed at time of discharge.  PRECAUTIONS If you experience chest pain or shortness of breath - call 911 immediately for transfer to the hospital emergency department.  If you develop a fever greater  that 101 F, purulent drainage from wound, increased redness or drainage from wound, foul odor from the wound/dressing, or calf pain - CONTACT YOUR SURGEON.                                                   FOLLOW-UP APPOINTMENTS Make sure you keep all of your appointments after your operation with your surgeon and caregivers. You should call the office at the above phone number and make an appointment for approximately two weeks after the date of your surgery or on the date instructed by your surgeon outlined in the "After Visit Summary".  RANGE OF MOTION AND STRENGTHENING EXERCISES  Rehabilitation of the knee is important following a knee injury or an operation. After just a few days of immobilization, the muscles of the thigh which control the knee become weakened and shrink (atrophy). Knee exercises are designed to build up  the tone and strength of the thigh muscles and to improve knee motion. Often times heat used for twenty to thirty minutes before working out will loosen up your tissues and help with improving the range of motion but do not use heat for the first two weeks following surgery. These exercises can be done on a training (exercise) mat, on the floor, on a table or on a bed. Use what ever works the best and is most comfortable for you Knee exercises include:  Leg Lifts - While your knee is still immobilized in a splint or cast, you can do straight leg raises. Lift the leg to 60 degrees, hold for 3 sec, and slowly lower the leg. Repeat 10-20 times 2-3 times daily. Perform this exercise against resistance later as your knee gets better.  Quad and Hamstring Sets - Tighten up the muscle on the front of the thigh (Quad) and hold for 5-10 sec. Repeat this 10-20 times hourly. Hamstring sets are done by pushing the foot backward against an object and holding for 5-10 sec. Repeat as with quad sets.  Leg Slides: Lying on your back, slowly slide your foot toward your buttocks, bending your knee up off the floor (only go as far as is comfortable). Then slowly slide your foot back down until your leg is flat on the floor again. Angel Wings: Lying on your back spread your legs to the side as far apart as you can without causing discomfort.  A rehabilitation program following serious knee injuries can speed recovery and prevent re-injury in the future due to weakened muscles. Contact your doctor or a physical therapist for more information on knee rehabilitation.   POST-OPERATIVE OPIOID TAPER INSTRUCTIONS: It is important to wean off of your opioid medication as soon as possible. If you do not need pain medication after your surgery it is ok to stop day one. Opioids include: Codeine, Hydrocodone(Norco, Vicodin), Oxycodone(Percocet, oxycontin) and hydromorphone amongst others.  Long term and even short term use of opiods can  cause: Increased pain response Dependence Constipation Depression Respiratory depression And more.  Withdrawal symptoms can include Flu like symptoms Nausea, vomiting And more Techniques to manage these symptoms Hydrate well Eat regular healthy meals Stay active Use relaxation techniques(deep breathing, meditating, yoga) Do Not substitute Alcohol to help with tapering If you have been on opioids for less than two weeks and do not have pain than it is ok to stop all  together.  Plan to wean off of opioids This plan should start within one week post op of your joint replacement. Maintain the same interval or time between taking each dose and first decrease the dose.  Cut the total daily intake of opioids by one tablet each day Next start to increase the time between doses. The last dose that should be eliminated is the evening dose.   IF YOU ARE TRANSFERRED TO A SKILLED REHAB FACILITY If the patient is transferred to a skilled rehab facility following release from the hospital, a list of the current medications will be sent to the facility for the patient to continue.  When discharged from the skilled rehab facility, please have the facility set up the patient's Home Health Physical Therapy prior to being released. Also, the skilled facility will be responsible for providing the patient with their medications at time of release from the facility to include their pain medication, the muscle relaxants, and their blood thinner medication. If the patient is still at the rehab facility at time of the two week follow up appointment, the skilled rehab facility will also need to assist the patient in arranging follow up appointment in our office and any transportation needs.  MAKE SURE YOU:  Understand these instructions.  Get help right away if you are not doing well or get worse.   DENTAL ANTIBIOTICS:  In most cases prophylactic antibiotics for Dental procdeures after total joint surgery are  not necessary.  Exceptions are as follows:  1. History of prior total joint infection  2. Severely immunocompromised (Organ Transplant, cancer chemotherapy, Rheumatoid biologic medications such as Humera)  3. Poorly controlled diabetes (A1C &gt; 8.0, blood glucose over 200)  If you have one of these conditions, contact your surgeon for an antibiotic prescription, prior to your dental procedure.    Pick up stool softner and laxative for home use following surgery while on pain medications. Do not submerge incision under water. Please use good hand washing techniques while changing dressing each day. May shower starting three days after surgery. Please use a clean towel to pat the incision dry following showers. Continue to use ice for pain and swelling after surgery. Do not use any lotions or creams on the incision until instructed by your surgeon.

## 2023-08-11 ENCOUNTER — Encounter (HOSPITAL_COMMUNITY): Payer: Self-pay | Admitting: Orthopedic Surgery

## 2023-08-24 ENCOUNTER — Telehealth: Payer: Self-pay | Admitting: Allergy & Immunology

## 2023-08-24 NOTE — Telephone Encounter (Signed)
 PT called in about $100 bill she received in the mail, I attempted to review and advised would need further assistance from Billing team. Confirmed callback [phone], she thanked

## 2023-08-25 NOTE — Telephone Encounter (Signed)
 Return call to Rebecca Dougherty ..regarding a statement she received from [facility] showing balance of 105. Informed her that insurance paid and after the contractual adjustments she owes 4.00 from each visit after 4 visits with [facility]/allergy asthma total amt  16.00.

## 2023-11-18 NOTE — H&P (Signed)
 TOTAL KNEE ADMISSION H&P  Patient is being admitted for left total knee arthroplasty.  Subjective:  Chief Complaint: Left knee pain.  HPI: Rebecca Dougherty, 53 y.o. female has a history of pain and functional disability in the left knee due to arthritis and has failed non-surgical conservative treatments for greater than 12 weeks to include NSAID's and/or analgesics, corticosteriod injections, viscosupplementation injections, and activity modification. Onset of symptoms was gradual, starting several years ago with gradually worsening course since that time. The patient noted no past surgery on the left knee.  Patient currently rates pain in the left knee at 8 out of 10 with activity. Patient has worsening of pain with activity and weight bearing and pain that interferes with activities of daily living. Patient has evidence of periarticular osteophytes and joint space narrowing by imaging studies. There is no active infection.  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   Closed fracture of right ankle    History of meniscal tear    Arthritis of knee, right     Past Medical History:  Diagnosis Date   Ambulates with cane    Anemia    years ago   Anxiety    no per pt   Asthma    allergy induced   Cluster headaches    Complication of anesthesia    pt has hypotension during anesthesia, woke during the procedure but couldn't open eyes and couldn;t move   Depression    GERD (gastroesophageal reflux disease)    History of Laryngospasms    opens right up when relaxes self  last time occurred  2022 per pt   Hypotension    Migraines    migraines   Pneumonia    walking pneumonia yrs ago   Pre-diabetes    tia    TIA's due to migraines  November, 2019, told by new neurologist 2023 no tias    Past Surgical History:  Procedure Laterality Date   BREAST REDUCTION SURGERY     HYSTEROSCOPY     yrs ago   HYSTEROSCOPY N/A 08/04/2022   Procedure:  HYSTEROSCOPY;  Surgeon: Armond Cape, MD;  Location: [facility];  Service: Gynecology;  Laterality: N/A;  removal of impacted foreign body.   IR RADIOLOGIST EVAL & MGMT  10/14/2022   IUD REMOVAL N/A 08/04/2022   Procedure: INTRAUTERINE DEVICE (IUD) REMOVAL removal of PARTIAL IUD;  Surgeon: Armond Cape, MD;  Location: [facility];  Service: Gynecology;  Laterality: N/A;   KNEE ARTHROSCOPY Right 10/22/2016   Procedure: Right Knee Arthroscopy and Debridement;  Surgeon: Harden Jerona GAILS, MD;  Location: [facility];  Service: Orthopedics;  Laterality: Right;   KNEE ARTHROSCOPY Left    KNEE ARTHROSCOPY Right    KNEE ARTHROSCOPY Right    MENISCUS REPAIR Right    MENISCUS REPAIR Left    ORIF ANKLE FRACTURE Right 01/08/2018   Procedure: OPEN REDUCTION INTERNAL FIXATION (ORIF) RIGHT ANKLE FRACTURE;  Surgeon: Harden Jerona GAILS, MD;  Location: [facility] OR;  Service: Orthopedics;  Laterality: Right;   right hand surgery  2023   TOTAL KNEE ARTHROPLASTY Right 08/10/2023   Procedure: ARTHROPLASTY, KNEE, TOTAL;  Surgeon: Melodi Lerner, MD;  Location: [facility];  Service: Orthopedics;  Laterality: Right;    Prior to Admission medications   Medication Sig Start Date End Date Taking? Authorizing Provider  Acetaminophen  (TYLENOL ) 325 MG CAPS Take 1,000 mg by mouth 3 (three) times daily as needed.    [provider]  albuterol (VENTOLIN HFA) 108 (90 Base) MCG/ACT inhaler Inhale 2 puffs into the lungs every 6 (six) hours as needed for wheezing or shortness of breath (or seasonal allergies).    [provider]  ASHWAGANDHA PO Take 1.95 g by mouth daily.    [provider]  aspirin  EC 81 MG tablet Take 1 tablet (81 mg total) by mouth as directed. Take an 81 mg Aspirin  two times a day for three weeks following surgery. Then take an 81 mg Aspirin  once a day for three weeks. Then discontinue Aspirin . 08/10/23   Kristian Stabs, PA  buPROPion (WELLBUTRIN XL) 150 MG 24 hr tablet  Take 150 mg by mouth daily. 08/12/16   [provider]  Carnitine-B5-B6 (L-CARNITINE 500 PO) Take 1,000 mg by mouth daily.    [provider]  cetirizine (ZYRTEC) 10 MG tablet Take 10 mg by mouth daily as needed for allergies.    [provider]  CREATINE PO Take 2 capsules by mouth daily.    [provider]  cyclobenzaprine  (FLEXERIL ) 10 MG tablet Take 10 mg by mouth 3 (three) times daily as needed for muscle spasms.    [provider]  cyclobenzaprine  (FLEXERIL ) 10 MG tablet Take 1 tablet (10 mg total) by mouth 3 (three) times daily as needed for muscle spasms. 08/10/23   Kristian Stabs, PA  estradiol (VIVELLE-DOT) 0.1 MG/24HR patch Place 1 patch onto the skin 2 (two) times a week. 07/02/23 07/01/24  [provider]  Ferrous Sulfate (IRON PO) Take 5 mLs by mouth daily.    [provider]  fluticasone (FLONASE) 50 MCG/ACT nasal spray Place 1 spray into both nostrils daily as needed for allergies.     [provider]  Gamma-Aminobutyric Acid (GABA PO) Take 750 mg by mouth daily.    [provider]  KRILL OIL PO Take 3,000 mg by mouth daily.    [provider]  L-THEANINE PO Take 800 mg by mouth daily.    [provider]  Lisdexamfetamine Dimesylate (VYVANSE) 40 MG CHEW Chew 40 mg by mouth daily.    [provider]  MAGNESIUM CITRATE PO Take 800 mg by mouth once.    [provider]  MAGNESIUM GLYCINATE PO Take 800 mg by mouth daily.    [provider]  MAGNESIUM PO Take 500 mg by mouth daily. Magnesium cerenote    [provider]  Melatonin-Theanine (SLEEP+CALM PO) Place 2 tablets under the tongue at bedtime.    [provider]  metformin (FORTAMET) 500 MG (OSM) 24 hr tablet Take 500 mg by mouth daily with breakfast.    [provider]  montelukast (SINGULAIR) 10 MG tablet Take 10 mg by mouth at bedtime.    [provider]  OVER THE COUNTER  MEDICATION Take 750 mg by mouth daily. [place]    [provider]  oxyCODONE  (ROXICODONE ) 5 MG immediate release tablet Take 1-2 tablets (5-10 mg total) by mouth every 6 (six) hours as needed for severe pain (pain score 7-10). 08/10/23 08/09/24  Kristian Stabs, PA  progesterone (PROMETRIUM) 200 MG capsule Take 200 mg by mouth daily.    [provider]  promethazine  (PHENERGAN ) 12.5 MG tablet Take 1 tablet (12.5 mg total) by mouth every 6 (six) hours as needed for nausea or vomiting. Patient not taking: Reported on 07/22/2023 01/18/18   Rayburn, Elouise Phlegm, PA-C  promethazine  (PHENERGAN ) 12.5 MG tablet Take 1 tablet (12.5 mg total) by mouth every  6 (six) hours as needed for nausea or vomiting. 08/10/23   Kristian Stabs, PA  Taurine 500 MG CAPS Take 1,000 mg by mouth daily.    [provider]  traMADol  (ULTRAM ) 50 MG tablet Take 1-2 tablets (50-100 mg total) by mouth every 6 (six) hours as needed. 08/10/23 08/09/24  Kristian Stabs, PA  TURMERIC PO Take 1 capsule by mouth daily.    [provider]  VITAMIN D-VITAMIN K PO Take 1 tablet by mouth daily.    [provider]  vitamin E 1000 UNIT capsule Take 1,000 Units by mouth daily.    [provider]  zinc gluconate 50 MG tablet Take 50 mg by mouth daily.    [provider]    Allergies  Allergen Reactions   Molybdenum     Burns skin makes it raw if prolonged exposure   Nickel Hives    Per patch test patient displayed reaction of 1+   Percocet [Oxycodone -Acetaminophen ] Nausea And Vomiting, Nausea Only and Other (See Comments)    EXTREME NAUSEA to the point of throwing up (left patient with a hangover, also)   Morphine And Codeine Other (See Comments)    HYPOTHERMIA and does not help with pain    Social History   Socioeconomic History   Marital status: Single    Spouse name: Not on file   Number of children: Not on file   Years of education: Not on file    Highest education level: Not on file  Occupational History   Not on file  Tobacco Use   Smoking status: Never    Passive exposure: Past   Smokeless tobacco: Never  Vaping Use   Vaping status: Never Used  Substance and Sexual Activity   Alcohol use: No   Drug use: Never   Sexual activity: Not on file  Other Topics Concern   Not on file  Social History Narrative   Not on file   Social Drivers of Health   Financial Resource Strain: Unknown (08/11/2021)   Received from [facility] visits prior to 03/22/2022.   Overall Financial Resource Strain (CARDIA)    Difficulty of Paying Living Expenses: Patient declined  Food Insecurity: Patient Declined (08/11/2021)   Received from [facility]   Hunger Vital Sign    Worried About Running Out of Food in the Last Year: Patient declined    Ran Out of Food in the Last Year: Patient declined  Transportation Needs: Patient Declined (08/11/2021)   Received from [facility]   PRAPARE - Transportation    Lack of Transportation (Medical): Patient declined    Lack of Transportation (Non-Medical): Patient declined  Physical Activity: Insufficiently Active (08/11/2021)   Received from [facility] visits prior to 03/22/2022., [facility]   Exercise Vital Sign    On average, how many days per week do you engage in moderate to strenuous exercise (like a brisk walk)?: 3 days    On average, how many minutes do you engage in exercise at this level?: 10 min  Stress: Stress Concern Present (08/11/2021)   Received from [facility] prior to 03/22/2022., [facility]   [employer] of Occupational Health - Occupational Stress Questionnaire    Feeling of Stress : Very much  Social Connections: Unknown (08/11/2021)   Received from [facility]   Social Connection and Isolation Panel    In a typical week, how many times do you talk on the phone  with family, friends, or neighbors?: More  than three times a week    How often do you get together with friends or relatives?: Once a week    How often do you attend church or religious services?: Patient declined    Do you belong to any clubs or organizations such as church groups, unions, fraternal or athletic groups, or school groups?: No    How often do you attend meetings of the clubs or organizations you belong to?: Never    Are you married, widowed, divorced, separated, never married, or living with a partner?: Patient declined  Intimate Partner Violence: Not on file    Tobacco Use: Low Risk  (10/21/2023)   Received from [facility]   Patient History    Smoking Tobacco Use: Never    Smokeless Tobacco Use: Never    Passive Exposure: Not on file   Social History   Substance and Sexual Activity  Alcohol Use No    Family History  Problem Relation Age of Onset   Alzheimer's disease Mother    Anxiety disorder Mother     ROS  Objective:  Physical Exam: Left knee: no effusion, range 0-125 degrees, crepitus on range of motion, tender medial greater than lateral, no instability.  Right knee: Arthroplasty scar is well healed. No effusion erythema or warmth. ROM 0-120. Stable medially and laterally at 0 and 30 degrees flexion. Minimal AP play. With active flexion patient is able to reproduce a clunk in the posterior knee that is painful and obstructs full flexion. Swelling is palpated in the popliteal region.  Radiographs taken of both knees in AP views demonstrate bone-on-bone arthritis in the medial compartments of both knees. The lateral view did not come out, so only the AP views were available. Previous lateral view demonstrated bone-on-bone patellofemoral arthritis.  Assessment/Plan:  End stage arthritis, left knee   The patient history, physical examination, clinical judgment of the provider and imaging studies are consistent with end stage degenerative joint disease of the left knee and total knee arthroplasty  is deemed medically necessary. The treatment options including medical management, injection therapy arthroscopy and arthroplasty were discussed at length. The risks and benefits of total knee arthroplasty were presented and reviewed. The risks due to aseptic loosening, infection, stiffness, patella tracking problems, thromboembolic complications and other imponderables were discussed. The patient acknowledged the explanation, agreed to proceed with the plan and consent was signed. Patient is being admitted for inpatient treatment for surgery, pain control, PT, OT, prophylactic antibiotics, VTE prophylaxis, progressive ambulation and ADLs and discharge planning. The patient is planning to be discharged home.   Patient's anticipated LOS is less than 2 midnights, meeting these requirements: - Younger than 65 - Lives within 1 hour of care - Has a competent adult at home to recover with post-op recover - NO history of  - Chronic pain requiring opiods  - Diabetes  - Coronary Artery Disease  - Heart failure  - Heart attack  - Stroke  - DVT/VTE  - Cardiac arrhythmia  - Respiratory Failure/COPD  - Renal failure  - Anemia  - Advanced Liver disease  Therapy Plans: EO Disposition: Home with boyfriend Planned DVT Prophylaxis: Aspirin  81mg  BID DME Needed: None PCP: Duwaine Roys, PA-C (clearance received)  TXA: IV Allergies: morphine (dizziness, nausea), adhesive, zofran  (palpitations), topiramate, emgality Metal Allergies: Nickel allergy Anesthesia Concerns: None BMI: 38.2 Last HgbA1c: Not diabetic Pharmacy: Pain regimen: Oxycodone , tramadol , IV dilaudid   Other: -No Aquacel -Requests percocet at discharge in place  of oxycodone . She does not want to take tylenol  separately. -SDD -Pt with a painful clunk in the posterior R knee since starting to practice crossing right leg over left. Suspect this is scar tissue/inflamed synovium. Knee is stable and o/w appears normal for this point in post-op  period. Recommended she continue exercises only in front to back plane of motion, avoid twisting and rotating, use ice.   - Patient was instructed on what medications to stop prior to surgery. - Follow-up visit in 2 weeks with Dr. Melodi - Begin physical therapy following surgery - Pre-operative lab work as pre-surgical testing - Prescriptions will be provided in hospital at time of discharge  Corean Sender, PA-C Orthopedic Surgery [facility] [place]

## 2023-11-30 NOTE — Patient Instructions (Signed)
 SURGICAL WAITING ROOM VISITATION  Patients having surgery or a procedure may have no more than 2 support people in the waiting area - these visitors may rotate.    Children under the age of 63 must have an adult with them who is not the patient.  Visitors with respiratory illnesses are discouraged from visiting and should remain at home.  If the patient needs to stay at the hospital during part of their recovery, the visitor guidelines for inpatient rooms apply. Pre-op nurse will coordinate an appropriate time for 1 support person to accompany patient in pre-op.  This support person may not rotate.    Please refer to the [facility] website for the visitor guidelines for Inpatients (after your surgery is over and you are in a regular room).       Your procedure is scheduled on: 12/07/23   Report to [facility] Main Entrance    Report to admitting at 5:15 AM   Call this number if you have problems the morning of surgery [phone]   Do not eat food :After Midnight.   After Midnight you may have the following liquids until 4:15 AM DAY OF SURGERY  Water Non-Citrus Juices (without pulp, NO RED-Apple, White grape, White cranberry) Black Coffee (NO MILK/CREAM OR CREAMERS, sugar ok)  Clear Tea (NO MILK/CREAM OR CREAMERS, sugar ok) regular and decaf                             Plain Jell-O (NO RED)                                           Fruit ices (not with fruit pulp, NO RED)                                     Popsicles (NO RED)                                                               Sports drinks like Gatorade (NO RED)                   The day of surgery:  Drink ONE (1) Pre-Surgery G2 at 4:15 AM the morning of surgery. Drink in one sitting. Do not sip.  This drink was given to you during your hospital  pre-op appointment visit. Nothing else to drink after completing the  Pre-Surgery  G2.  Oral Hygiene is also important to reduce your risk of infection.                                     Remember - BRUSH YOUR TEETH THE MORNING OF SURGERY WITH YOUR REGULAR TOOTHPASTE   Stop all vitamins and herbal supplements 7 days before surgery.   Take these medicines the morning of surgery with A SIP OF WATER: bupropion, cetirizine, estradiol, nasal spray, inhaler, tylenol  if needed, montelukast, progesterone.  DO NOT TAKE ANY ORAL DIABETIC MEDICATIONS DAY OF YOUR SURGERY  Hold Metformin the morning of surgery.             You may not have any metal on your body including hair pins, jewelry, and body piercing             Do not wear make-up, lotions, powders, perfumes/cologne, or deodorant  Do not wear nail polish including gel and S&S, artificial/acrylic nails, or any other type of covering on natural nails including finger and toenails. If you have artificial nails, gel coating, etc. that needs to be removed by a nail salon please have this removed prior to surgery or surgery may need to be canceled/ delayed if the surgeon/ anesthesia feels like they are unable to be safely monitored.   Do not shave  48 hours prior to surgery.              Do not bring valuables to the hospital. [facility] IS NOT             RESPONSIBLE   FOR VALUABLES.   Contacts, glasses, dentures or bridgework may not be worn into surgery.    DO NOT BRING YOUR HOME MEDICATIONS TO THE HOSPITAL. PHARMACY WILL DISPENSE MEDICATIONS LISTED ON YOUR MEDICATION LIST TO YOU DURING YOUR ADMISSION IN THE HOSPITAL!    Patients discharged on the day of surgery will not be allowed to drive home.  Someone NEEDS to stay with you for the first 24 hours after anesthesia.   Special Instructions: Bring a copy of your healthcare power of attorney and living will documents the day of surgery if you haven't scanned them before.              Please read over the following fact sheets you were given: IF YOU HAVE QUESTIONS ABOUT YOUR PRE-OP INSTRUCTIONS PLEASE CALL [phone] Rebecca Dougherty   If you received  a COVID test during your pre-op visit  it is requested that you wear a mask when out in public, stay away from anyone that may not be feeling well and notify your surgeon if you develop symptoms. If you test positive for Covid or have been in contact with anyone that has tested positive in the last 10 days please notify you surgeon.      Pre-operative 4 CHG Bath Instructions  DYNA-Hex 4 Chlorhexidine  Gluconate 4% Solution Antiseptic 4 fl. oz   You can play a key role in reducing the risk of infection after surgery. Your skin needs to be as free of germs as possible. You can reduce the number of germs on your skin by washing with CHG (chlorhexidine  gluconate) soap before surgery. CHG is an antiseptic soap that kills germs and continues to kill germs even after washing.   DO NOT use if you have an allergy to chlorhexidine /CHG or antibacterial soaps. If your skin becomes reddened or irritated, stop using the CHG and notify one of our RNs at   Please shower with the CHG soap starting 4 days before surgery using the following schedule:     Please keep in mind the following:  DO NOT shave, including legs and underarms, starting the day of your first shower.   You may shave your face at any point before/day of surgery.  Place clean sheets on your bed the day you start using CHG soap. Use a clean washcloth (not used since being washed) for each shower. DO NOT sleep with pets once you start using the CHG.  CHG Shower Instructions:  If you choose to wash  your hair and private area, wash first with your normal shampoo/soap.  After you use shampoo/soap, rinse your hair and body thoroughly to remove shampoo/soap residue.  Turn the water OFF and apply about 3 tablespoons (45 ml) of CHG soap to a CLEAN washcloth.  Apply CHG soap ONLY FROM YOUR NECK DOWN TO YOUR TOES (washing for 3-5 minutes)  DO NOT use CHG soap on face, private areas, open wounds, or sores.  Pay special attention to the area where your  surgery is being performed.  If you are having back surgery, having someone wash your back for you may be helpful. Wait 2 minutes after CHG soap is applied, then you may rinse off the CHG soap.  Pat dry with a clean towel  Put on clean clothes/pajamas   If you choose to wear lotion, please use ONLY the CHG-compatible lotions on the back of this paper.     Additional instructions for the day of surgery: DO NOT APPLY any lotions, deodorants, cologne, or perfumes.   Put on clean/comfortable clothes.  Brush your teeth.  Ask your nurse before applying any prescription medications to the skin.   CHG Compatible Lotions   Aveeno Moisturizing lotion  Cetaphil Moisturizing Cream  Cetaphil Moisturizing Lotion  Clairol Herbal Essence Moisturizing Lotion, Dry Skin  Clairol Herbal Essence Moisturizing Lotion, Extra Dry Skin  Clairol Herbal Essence Moisturizing Lotion, Normal Skin  Curel Age Defying Therapeutic Moisturizing Lotion with Alpha Hydroxy  Curel Extreme Care Body Lotion  Curel Soothing Hands Moisturizing Hand Lotion  Curel Therapeutic Moisturizing Cream, Fragrance-Free  Curel Therapeutic Moisturizing Lotion, Fragrance-Free  Curel Therapeutic Moisturizing Lotion, Original Formula  Eucerin Daily Replenishing Lotion  Eucerin Dry Skin Therapy Plus Alpha Hydroxy Crme  Eucerin Dry Skin Therapy Plus Alpha Hydroxy Lotion  Eucerin Original Crme  Eucerin Original Lotion  Eucerin Plus Crme Eucerin Plus Lotion  Eucerin TriLipid Replenishing Lotion  Keri Anti-Bacterial Hand Lotion  Keri Deep Conditioning Original Lotion Dry Skin Formula Softly Scented  Keri Deep Conditioning Original Lotion, Fragrance Free Sensitive Skin Formula  Keri Lotion Fast Absorbing Fragrance Free Sensitive Skin Formula  Keri Lotion Fast Absorbing Softly Scented Dry Skin Formula  Keri Original Lotion  Keri Skin Renewal Lotion Keri Silky Smooth Lotion  Keri Silky Smooth Sensitive Skin Lotion  Nivea Body Creamy  Conditioning Oil  Nivea Body Extra Enriched Lotion  Nivea Body Original Lotion  Nivea Body Sheer Moisturizing Lotion Nivea Crme  Nivea Skin Firming Lotion  NutraDerm 30 Skin Lotion  NutraDerm Skin Lotion  NutraDerm Therapeutic Skin Cream  NutraDerm Therapeutic Skin Lotion  ProShield Protective Hand Cream  Incentive Spirometer   An incentive spirometer is a tool that can help keep your lungs clear and active. This tool measures how well you are filling your lungs with each breath. Taking long deep breaths may help reverse or decrease the chance of developing breathing (pulmonary) problems (especially infection) following: A long period of time when you are unable to move or be active. BEFORE THE PROCEDURE  If the spirometer includes an indicator to show your best effort, your nurse or respiratory therapist will set it to a desired goal. If possible, sit up straight or lean slightly forward. Try not to slouch. Hold the incentive spirometer in an upright position. INSTRUCTIONS FOR USE  Sit on the edge of your bed if possible, or sit up as far as you can in bed or on a chair. Hold the incentive spirometer in an upright position. Breathe out normally. Place the  mouthpiece in your mouth and seal your lips tightly around it. Breathe in slowly and as deeply as possible, raising the piston or the ball toward the top of the column. Hold your breath for 3-5 seconds or for as long as possible. Allow the piston or ball to fall to the bottom of the column. Remove the mouthpiece from your mouth and breathe out normally. Rest for a few seconds and repeat Steps 1 through 7 at least 10 times every 1-2 hours when you are awake. Take your time and take a few normal breaths between deep breaths. The spirometer may include an indicator to show your best effort. Use the indicator as a goal to work toward during each repetition. After each set of 10 deep breaths, practice coughing to be sure your lungs are  clear. If you have an incision (the cut made at the time of surgery), support your incision when coughing by placing a pillow or rolled up towels firmly against it. Once you are able to get out of bed, walk around indoors and cough well. You may stop using the incentive spirometer when instructed by your caregiver.  RISKS AND COMPLICATIONS Take your time so you do not get dizzy or light-headed. If you are in pain, you may need to take or ask for pain medication before doing incentive spirometry. It is harder to take a deep breath if you are having pain. AFTER USE Rest and breathe slowly and easily. It can be helpful to keep track of a log of your progress. Your caregiver can provide you with a simple table to help with this. If you are using the spirometer at home, follow these instructions: SEEK MEDICAL CARE IF:  You are having difficultly using the spirometer. You have trouble using the spirometer as often as instructed. Your pain medication is not giving enough relief while using the spirometer. You develop fever of 100.5 F (38.1 C) or higher. SEEK IMMEDIATE MEDICAL CARE IF:  You cough up bloody sputum that had not been present before. You develop fever of 102 F (38.9 C) or greater. You develop worsening pain at or near the incision site. MAKE SURE YOU:  Understand these instructions. Will watch your condition. Will get help right away if you are not doing well or get worse. Document Released: 05/19/2006 Document Revised: 03/31/2011 Document Reviewed: 07/20/2006 [facility] Patient Information 2014 [place], [place].

## 2023-11-30 NOTE — Progress Notes (Signed)
 COVID Vaccine received:  []  No [x]  Yes Date of any COVID positive Test in last 90 days: no PCP - Gerard Roys PA-C Cardiologist - DR. Zahid Junagadhwalla  Chest x-ray -  EKG -   Stress Test -  ECHO -  Cardiac Cath -   Bowel Prep - [x]  No  []   Yes ______  Pacemaker / ICD device [x]  No []  Yes   Spinal Cord Stimulator:[x]  No []  Yes       History of Sleep Apnea? [x]  No []  Yes   CPAP used?- []  No [x]  Yes    Does the patient monitor blood sugar?          [x]  No []  Yes  []  N/A  Patient has: [x]  NO Hx DM   []  Pre-DM                 []  DM1  []   DM2 Does patient have a [employer] or Dexacom? []  No []  Yes   Fasting Blood Sugar Ranges-  Checks Blood Sugar _____ times a day  GLP1 agonist / usual dose - no GLP1 instructions:  SGLT-2 inhibitors / usual dose - no SGLT-2 instructions:   Blood Thinner / Instructions:no Aspirin  Instructions:a asa 81mg  Stpped 11/27/23  Comments:   Activity level: Patient is able  to climb a flight of stairs without difficulty; [x]  No CP  [x]  No SOB,   Patient can  perform ADLs without assistance.   Anesthesia review:   Patient denies shortness of breath, fever, cough and chest pain at PAT appointment.  Patient verbalized understanding and agreement to the Pre-Surgical Instructions that were given to them at this PAT appointment. Patient was also educated of the need to review these PAT instructions again prior to his/her surgery.I reviewed the appropriate phone numbers to call if they have any and questions or concerns.

## 2023-12-01 ENCOUNTER — Encounter (HOSPITAL_COMMUNITY)
Admission: RE | Admit: 2023-12-01 | Discharge: 2023-12-01 | Disposition: A | Discharge status: Home / Self Care | Source: Ambulatory Visit | Attending: Orthopedic Surgery | Admitting: Orthopedic Surgery

## 2023-12-01 ENCOUNTER — Other Ambulatory Visit: Payer: Self-pay

## 2023-12-01 ENCOUNTER — Encounter (HOSPITAL_COMMUNITY): Payer: Self-pay

## 2023-12-01 VITALS — Ht 66.0 in | Wt 242.0 lb

## 2023-12-01 DIAGNOSIS — Z01818 Encounter for other preprocedural examination: Secondary | ICD-10-CM

## 2023-12-01 HISTORY — DX: Unspecified osteoarthritis, unspecified site: M19.90

## 2023-12-01 NOTE — Progress Notes (Signed)
 Pt. Was a no show for PST appointment. She said she forgot. Did phone interview .Pt. identified herself using name and DOB. Pt. Aswered all questions. Pre op instructions given. She verbalized understanding. She will come tomorrow 12/02/23 for PCR, soap, drink ,written instructions and pre admit.

## 2023-12-02 ENCOUNTER — Encounter (HOSPITAL_COMMUNITY)
Admission: RE | Admit: 2023-12-02 | Discharge: 2023-12-02 | Disposition: A | Discharge status: Home / Self Care | Source: Ambulatory Visit | Attending: Orthopedic Surgery | Admitting: Orthopedic Surgery

## 2023-12-02 DIAGNOSIS — Z01812 Encounter for preprocedural laboratory examination: Secondary | ICD-10-CM | POA: Diagnosis present

## 2023-12-02 DIAGNOSIS — Z01818 Encounter for other preprocedural examination: Secondary | ICD-10-CM

## 2023-12-02 LAB — SURGICAL PCR SCREEN
MRSA, PCR: NEGATIVE
Staphylococcus aureus: NEGATIVE

## 2023-12-05 NOTE — Anesthesia Preprocedure Evaluation (Signed)
 Anesthesia Evaluation  Patient identified by MRN, date of birth, ID band Patient awake    Reviewed: Allergy & Precautions, NPO status , Patient's Chart, lab work & pertinent test results  History of Anesthesia Complications (+) history of anesthetic complications  Airway Mallampati: II  TM Distance: >3 FB     Dental no notable dental hx. (+) Teeth Intact, Dental Advisory Given, Caps   Pulmonary asthma , pneumonia, resolved   Pulmonary exam normal breath sounds clear to auscultation       Cardiovascular negative cardio ROS Normal cardiovascular exam Rhythm:Regular Rate:Normal     Neuro/Psych  Headaches PSYCHIATRIC DISORDERS Anxiety Depression    CVA    GI/Hepatic Neg liver ROS,GERD  Medicated,,  Endo/Other  Obesity Pre Diabetes  Renal/GU negative Renal ROS  negative genitourinary   Musculoskeletal  (+) Arthritis , Osteoarthritis,  OA left knee   Abdominal  (+) + obese  Peds  Hematology  (+) Blood dyscrasia, anemia   Anesthesia Other Findings   Reproductive/Obstetrics                              Anesthesia Physical Anesthesia Plan  ASA: 2  Anesthesia Plan: Spinal   Post-op Pain Management: Minimal or no pain anticipated and Regional block*   Induction: Intravenous  PONV Risk Score and Plan: 3 and Treatment may vary due to age or medical condition and Propofol  infusion  Airway Management Planned: Natural Airway and Simple Face Mask  Additional Equipment: None  Intra-op Plan:   Post-operative Plan:   Informed Consent: I have reviewed the patients History and Physical, chart, labs and discussed the procedure including the risks, benefits and alternatives for the proposed anesthesia with the patient or authorized representative who has indicated his/her understanding and acceptance.     Dental advisory given  Plan Discussed with: CRNA and Anesthesiologist  Anesthesia Plan  Comments:          Anesthesia Quick Evaluation

## 2023-12-07 ENCOUNTER — Other Ambulatory Visit: Payer: Self-pay

## 2023-12-07 ENCOUNTER — Ambulatory Visit (HOSPITAL_BASED_OUTPATIENT_CLINIC_OR_DEPARTMENT_OTHER): Payer: Self-pay | Admitting: Anesthesiology

## 2023-12-07 ENCOUNTER — Ambulatory Visit (HOSPITAL_COMMUNITY)
Admission: RE | Admit: 2023-12-07 | Discharge: 2023-12-07 | Disposition: A | Discharge status: Home / Self Care | Attending: Orthopedic Surgery | Admitting: Orthopedic Surgery

## 2023-12-07 ENCOUNTER — Encounter (HOSPITAL_COMMUNITY)
Admission: RE | Disposition: A | Discharge status: Home / Self Care | Payer: Self-pay | Source: Home / Self Care | Attending: Orthopedic Surgery

## 2023-12-07 ENCOUNTER — Encounter (HOSPITAL_COMMUNITY): Payer: Self-pay | Admitting: Anesthesiology

## 2023-12-07 ENCOUNTER — Encounter (HOSPITAL_COMMUNITY): Payer: Self-pay | Admitting: Orthopedic Surgery

## 2023-12-07 DIAGNOSIS — R7303 Prediabetes: Secondary | ICD-10-CM | POA: Insufficient documentation

## 2023-12-07 DIAGNOSIS — K219 Gastro-esophageal reflux disease without esophagitis: Secondary | ICD-10-CM | POA: Diagnosis not present

## 2023-12-07 DIAGNOSIS — Z818 Family history of other mental and behavioral disorders: Secondary | ICD-10-CM | POA: Diagnosis not present

## 2023-12-07 DIAGNOSIS — M1712 Unilateral primary osteoarthritis, left knee: Secondary | ICD-10-CM | POA: Diagnosis not present

## 2023-12-07 DIAGNOSIS — F418 Other specified anxiety disorders: Secondary | ICD-10-CM | POA: Diagnosis not present

## 2023-12-07 DIAGNOSIS — E669 Obesity, unspecified: Secondary | ICD-10-CM | POA: Diagnosis not present

## 2023-12-07 DIAGNOSIS — Z6839 Body mass index (BMI) 39.0-39.9, adult: Secondary | ICD-10-CM | POA: Diagnosis not present

## 2023-12-07 DIAGNOSIS — Z7182 Exercise counseling: Secondary | ICD-10-CM | POA: Diagnosis not present

## 2023-12-07 DIAGNOSIS — J4489 Other specified chronic obstructive pulmonary disease: Secondary | ICD-10-CM | POA: Insufficient documentation

## 2023-12-07 DIAGNOSIS — Z8673 Personal history of transient ischemic attack (TIA), and cerebral infarction without residual deficits: Secondary | ICD-10-CM | POA: Diagnosis not present

## 2023-12-07 DIAGNOSIS — M25762 Osteophyte, left knee: Secondary | ICD-10-CM | POA: Diagnosis not present

## 2023-12-07 HISTORY — PX: TOTAL KNEE ARTHROPLASTY: SHX125

## 2023-12-07 SURGERY — ARTHROPLASTY, KNEE, TOTAL
Anesthesia: Spinal | Site: Knee | Laterality: Left

## 2023-12-07 MED ORDER — PROPOFOL 1000 MG/100ML IV EMUL
INTRAVENOUS | Status: AC
  Filled 2023-12-07: qty 100

## 2023-12-07 MED ORDER — TRANEXAMIC ACID-NACL 1000-0.7 MG/100ML-% IV SOLN
1000.0000 mg | INTRAVENOUS | Status: AC
  Administered 2023-12-07: 1000 mg via INTRAVENOUS
  Filled 2023-12-07: qty 100

## 2023-12-07 MED ORDER — ONDANSETRON HCL 4 MG/2ML IJ SOLN: 4.0000 mg | Freq: Once | INTRAMUSCULAR | Status: DC | PRN

## 2023-12-07 MED ORDER — FENTANYL CITRATE (PF) 100 MCG/2ML IJ SOLN
INTRAMUSCULAR | Status: AC
  Filled 2023-12-07: qty 2

## 2023-12-07 MED ORDER — LACTATED RINGERS IV BOLUS: 250.0000 mL | Freq: Once | INTRAVENOUS | Status: DC

## 2023-12-07 MED ORDER — ONDANSETRON HCL 4 MG PO TABS: 4.0000 mg | ORAL_TABLET | Freq: Three times a day (TID) | ORAL | 0 refills | Status: AC | PRN

## 2023-12-07 MED ORDER — ONDANSETRON HCL 4 MG/2ML IJ SOLN
INTRAMUSCULAR | Status: DC | PRN
  Administered 2023-12-07: 4 mg via INTRAVENOUS

## 2023-12-07 MED ORDER — ACETAMINOPHEN 10 MG/ML IV SOLN
1000.0000 mg | Freq: Four times a day (QID) | INTRAVENOUS | Status: DC
  Administered 2023-12-07: 1000 mg via INTRAVENOUS
  Filled 2023-12-07: qty 100

## 2023-12-07 MED ORDER — METOCLOPRAMIDE HCL 5 MG/ML IJ SOLN: 5.0000 mg | Freq: Three times a day (TID) | INTRAMUSCULAR | Status: DC | PRN

## 2023-12-07 MED ORDER — GABAPENTIN 300 MG PO CAPS: ORAL_CAPSULE | ORAL | 0 refills | Status: AC

## 2023-12-07 MED ORDER — ONDANSETRON HCL 4 MG PO TABS: 4.0000 mg | ORAL_TABLET | Freq: Four times a day (QID) | ORAL | Status: DC | PRN

## 2023-12-07 MED ORDER — TRAMADOL HCL 50 MG PO TABS: 50.0000 mg | ORAL_TABLET | Freq: Four times a day (QID) | ORAL | 0 refills | Status: AC | PRN

## 2023-12-07 MED ORDER — CEFAZOLIN SODIUM-DEXTROSE 2-4 GM/100ML-% IV SOLN
2.0000 g | INTRAVENOUS | Status: AC
  Administered 2023-12-07: 2 g via INTRAVENOUS
  Filled 2023-12-07: qty 100

## 2023-12-07 MED ORDER — LACTATED RINGERS IV BOLUS
500.0000 mL | Freq: Once | INTRAVENOUS | Status: AC
  Administered 2023-12-07: 500 mL via INTRAVENOUS

## 2023-12-07 MED ORDER — SODIUM CHLORIDE 0.9 % IR SOLN
Status: DC | PRN
  Administered 2023-12-07: 1000 mL

## 2023-12-07 MED ORDER — OXYCODONE-ACETAMINOPHEN 5-325 MG PO TABS: 1.0000 | ORAL_TABLET | ORAL | 0 refills | Status: AC | PRN

## 2023-12-07 MED ORDER — ACETAMINOPHEN 500 MG PO TABS: 1000.0000 mg | ORAL_TABLET | Freq: Four times a day (QID) | ORAL | Status: DC

## 2023-12-07 MED ORDER — ONDANSETRON HCL 4 MG/2ML IJ SOLN
INTRAMUSCULAR | Status: AC
Start: 2023-12-07 — End: 2023-12-07
  Filled 2023-12-07: qty 2

## 2023-12-07 MED ORDER — PHENYLEPHRINE 80 MCG/ML (10ML) SYRINGE FOR IV PUSH (FOR BLOOD PRESSURE SUPPORT)
PREFILLED_SYRINGE | INTRAVENOUS | Status: DC | PRN
  Administered 2023-12-07: 160 ug via INTRAVENOUS

## 2023-12-07 MED ORDER — PHENYLEPHRINE HCL (PRESSORS) 10 MG/ML IV SOLN
INTRAVENOUS | Status: AC
  Filled 2023-12-07: qty 1

## 2023-12-07 MED ORDER — HYDROMORPHONE HCL 1 MG/ML IJ SOLN: 0.2500 mg | INTRAMUSCULAR | Status: DC | PRN

## 2023-12-07 MED ORDER — PHENYLEPHRINE HCL-NACL 20-0.9 MG/250ML-% IV SOLN
INTRAVENOUS | Status: DC | PRN
  Administered 2023-12-07: 50 ug/min via INTRAVENOUS

## 2023-12-07 MED ORDER — POVIDONE-IODINE 10 % EX SWAB: 2.0000 | Freq: Once | CUTANEOUS | Status: DC

## 2023-12-07 MED ORDER — GLYCOPYRROLATE 0.2 MG/ML IJ SOLN
INTRAMUSCULAR | Status: AC
  Filled 2023-12-07: qty 1

## 2023-12-07 MED ORDER — MIDAZOLAM HCL 2 MG/2ML IJ SOLN
INTRAMUSCULAR | Status: AC
  Filled 2023-12-07: qty 2

## 2023-12-07 MED ORDER — OXYCODONE HCL 5 MG PO TABS
ORAL_TABLET | ORAL | Status: AC
  Filled 2023-12-07: qty 1

## 2023-12-07 MED ORDER — LACTATED RINGERS IV SOLN: INTRAVENOUS | Status: DC

## 2023-12-07 MED ORDER — CHLORHEXIDINE GLUCONATE 0.12 % MT SOLN
15.0000 mL | Freq: Once | OROMUCOSAL | Status: AC
  Administered 2023-12-07: 15 mL via OROMUCOSAL

## 2023-12-07 MED ORDER — METHOCARBAMOL 500 MG PO TABS: 500.0000 mg | ORAL_TABLET | Freq: Four times a day (QID) | ORAL | 0 refills | Status: AC | PRN

## 2023-12-07 MED ORDER — DEXMEDETOMIDINE HCL IN NACL 80 MCG/20ML IV SOLN
INTRAVENOUS | Status: AC
  Filled 2023-12-07: qty 20

## 2023-12-07 MED ORDER — ORAL CARE MOUTH RINSE: 15.0000 mL | Freq: Once | OROMUCOSAL | Status: AC

## 2023-12-07 MED ORDER — EPHEDRINE 5 MG/ML INJ
INTRAVENOUS | Status: AC
  Filled 2023-12-07: qty 5

## 2023-12-07 MED ORDER — PROPOFOL 500 MG/50ML IV EMUL
INTRAVENOUS | Status: DC | PRN
Start: 2023-12-07 — End: 2023-12-07
  Administered 2023-12-07: 20 mg via INTRAVENOUS
  Administered 2023-12-07: 100 ug/kg/min via INTRAVENOUS
  Administered 2023-12-07: 20 mg via INTRAVENOUS
  Administered 2023-12-07 (×2): 30 mg via INTRAVENOUS

## 2023-12-07 MED ORDER — PROPOFOL 10 MG/ML IV BOLUS
INTRAVENOUS | Status: AC
  Filled 2023-12-07: qty 20

## 2023-12-07 MED ORDER — PHENYLEPHRINE 80 MCG/ML (10ML) SYRINGE FOR IV PUSH (FOR BLOOD PRESSURE SUPPORT)
PREFILLED_SYRINGE | INTRAVENOUS | Status: AC
  Filled 2023-12-07: qty 10

## 2023-12-07 MED ORDER — MIDAZOLAM HCL 5 MG/5ML IJ SOLN
INTRAMUSCULAR | Status: DC | PRN
Start: 2023-12-07 — End: 2023-12-07
  Administered 2023-12-07: 2 mg via INTRAVENOUS

## 2023-12-07 MED ORDER — SODIUM CHLORIDE 0.9 % IV SOLN
INTRAVENOUS | Status: DC | PRN
  Administered 2023-12-07: 80 mL

## 2023-12-07 MED ORDER — OXYCODONE HCL 5 MG PO TABS
5.0000 mg | ORAL_TABLET | ORAL | Status: DC | PRN
  Administered 2023-12-07 (×2): 5 mg via ORAL

## 2023-12-07 MED ORDER — HYDROMORPHONE HCL 1 MG/ML IJ SOLN
0.5000 mg | INTRAMUSCULAR | Status: DC | PRN
  Administered 2023-12-07: 0.5 mg via INTRAVENOUS

## 2023-12-07 MED ORDER — 0.9 % SODIUM CHLORIDE (POUR BTL) OPTIME
TOPICAL | Status: DC | PRN
  Administered 2023-12-07: 1000 mL

## 2023-12-07 MED ORDER — ONDANSETRON HCL 4 MG/2ML IJ SOLN: 4.0000 mg | Freq: Four times a day (QID) | INTRAMUSCULAR | Status: DC | PRN

## 2023-12-07 MED ORDER — DEXAMETHASONE SOD PHOSPHATE PF 10 MG/ML IJ SOLN
8.0000 mg | Freq: Once | INTRAMUSCULAR | Status: AC
  Administered 2023-12-07: 8 mg via INTRAVENOUS

## 2023-12-07 MED ORDER — BUPIVACAINE LIPOSOME 1.3 % IJ SUSP: 20.0000 mL | Freq: Once | INTRAMUSCULAR | Status: DC

## 2023-12-07 MED ORDER — METHOCARBAMOL 1000 MG/10ML IJ SOLN
500.0000 mg | Freq: Four times a day (QID) | INTRAMUSCULAR | Status: DC | PRN
Start: 2023-12-07 — End: 2023-12-07

## 2023-12-07 MED ORDER — TRAMADOL HCL 50 MG PO TABS: 50.0000 mg | ORAL_TABLET | Freq: Four times a day (QID) | ORAL | Status: DC | PRN

## 2023-12-07 MED ORDER — CEFAZOLIN SODIUM-DEXTROSE 2-4 GM/100ML-% IV SOLN: 2.0000 g | Freq: Four times a day (QID) | INTRAVENOUS | Status: DC

## 2023-12-07 MED ORDER — DROPERIDOL 2.5 MG/ML IJ SOLN: 0.6250 mg | Freq: Once | INTRAMUSCULAR | Status: DC | PRN

## 2023-12-07 MED ORDER — FENTANYL CITRATE (PF) 100 MCG/2ML IJ SOLN
INTRAMUSCULAR | Status: DC | PRN
  Administered 2023-12-07: 50 ug via INTRAVENOUS

## 2023-12-07 MED ORDER — METHOCARBAMOL 500 MG PO TABS
ORAL_TABLET | ORAL | Status: AC
  Filled 2023-12-07: qty 1

## 2023-12-07 MED ORDER — BUPIVACAINE LIPOSOME 1.3 % IJ SUSP
INTRAMUSCULAR | Status: AC
  Filled 2023-12-07: qty 20

## 2023-12-07 MED ORDER — ASPIRIN 81 MG PO TBEC: DELAYED_RELEASE_TABLET | ORAL | 0 refills | Status: AC

## 2023-12-07 MED ORDER — BUPIVACAINE IN DEXTROSE 0.75-8.25 % IT SOLN
INTRATHECAL | Status: DC | PRN
  Administered 2023-12-07: 1.7 mL via INTRATHECAL

## 2023-12-07 MED ORDER — SODIUM CHLORIDE (PF) 0.9 % IJ SOLN
INTRAMUSCULAR | Status: AC
  Filled 2023-12-07: qty 10

## 2023-12-07 MED ORDER — METOCLOPRAMIDE HCL 5 MG PO TABS: 5.0000 mg | ORAL_TABLET | Freq: Three times a day (TID) | ORAL | Status: DC | PRN

## 2023-12-07 MED ORDER — METHOCARBAMOL 500 MG PO TABS
500.0000 mg | ORAL_TABLET | Freq: Four times a day (QID) | ORAL | Status: DC | PRN
Start: 2023-12-07 — End: 2023-12-07
  Administered 2023-12-07: 500 mg via ORAL

## 2023-12-07 MED ORDER — HYDROMORPHONE HCL 1 MG/ML IJ SOLN
INTRAMUSCULAR | Status: AC
  Filled 2023-12-07: qty 1

## 2023-12-07 MED ORDER — ROPIVACAINE HCL 5 MG/ML IJ SOLN
INTRAMUSCULAR | Status: DC | PRN
  Administered 2023-12-07: 30 mL via PERINEURAL

## 2023-12-07 MED ORDER — SODIUM CHLORIDE (PF) 0.9 % IJ SOLN
INTRAMUSCULAR | Status: AC
  Filled 2023-12-07: qty 50

## 2023-12-07 MED ORDER — EPHEDRINE SULFATE (PRESSORS) 25 MG/5ML IV SOSY
PREFILLED_SYRINGE | INTRAVENOUS | Status: DC | PRN
  Administered 2023-12-07: 10 mg via INTRAVENOUS

## 2023-12-07 SURGICAL SUPPLY — 45 items
BAG COUNTER SPONGE SURGICOUNT (BAG) IMPLANT
BAG ZIPLOCK 12X15 (MISCELLANEOUS) ×1 IMPLANT
BLADE SAG 18X100X1.27 (BLADE) ×1 IMPLANT
BLADE SAW SGTL 11.0X1.19X90.0M (BLADE) ×1 IMPLANT
BNDG ELASTIC 6INX 5YD STR LF (GAUZE/BANDAGES/DRESSINGS) ×1 IMPLANT
BOWL SMART MIX CTS (DISPOSABLE) ×1 IMPLANT
CEMENT HV SMART SET (Cement) ×2 IMPLANT
COMP FEM CMT STD KNEE 9 LT STL (Joint) IMPLANT
COVER SURGICAL LIGHT HANDLE (MISCELLANEOUS) ×1 IMPLANT
CUFF TRNQT CYL 34X4.125X (TOURNIQUET CUFF) ×1 IMPLANT
DERMABOND ADVANCED .7 DNX12 (GAUZE/BANDAGES/DRESSINGS) ×1 IMPLANT
DRAPE U-SHAPE 47X51 STRL (DRAPES) ×1 IMPLANT
DRSG AQUACEL AG ADV 3.5X10 (GAUZE/BANDAGES/DRESSINGS) ×1 IMPLANT
DURAPREP 26ML APPLICATOR (WOUND CARE) ×1 IMPLANT
ELECT REM PT RETURN 15FT ADLT (MISCELLANEOUS) ×1 IMPLANT
GAUZE PAD ABD 8X10 STRL (GAUZE/BANDAGES/DRESSINGS) IMPLANT
GAUZE SPONGE 4X4 12PLY STRL (GAUZE/BANDAGES/DRESSINGS) IMPLANT
GLOVE BIO SURGEON STRL SZ 6.5 (GLOVE) IMPLANT
GLOVE BIO SURGEON STRL SZ8 (GLOVE) ×1 IMPLANT
GLOVE BIOGEL PI IND STRL 7.0 (GLOVE) ×1 IMPLANT
GLOVE BIOGEL PI IND STRL 8 (GLOVE) ×1 IMPLANT
GOWN STRL REUS W/ TWL LRG LVL3 (GOWN DISPOSABLE) ×1 IMPLANT
HOLDER FOLEY CATH W/STRAP (MISCELLANEOUS) ×1 IMPLANT
IMMOBILIZER KNEE 20 THIGH 36 (SOFTGOODS) ×1 IMPLANT
INSERT TIBIAL PLY L EF6-9X12 (Knees) IMPLANT
KIT TURNOVER KIT A (KITS) ×1 IMPLANT
MANIFOLD NEPTUNE II (INSTRUMENTS) ×1 IMPLANT
NS IRRIG 1000ML POUR BTL (IV SOLUTION) ×1 IMPLANT
PACK TOTAL KNEE CUSTOM (KITS) ×1 IMPLANT
PADDING CAST COTTON 6X4 STRL (CAST SUPPLIES) ×2 IMPLANT
PENCIL SMOKE EVACUATOR (MISCELLANEOUS) ×1 IMPLANT
PIN DRILL HDLS TROCAR 75 4PK (PIN) IMPLANT
PROTECTOR NERVE ULNAR (MISCELLANEOUS) ×1 IMPLANT
SCREW FEMALE HEX FIX 25X2.5 (ORTHOPEDIC DISPOSABLE SUPPLIES) IMPLANT
SET HNDPC FAN SPRY TIP SCT (DISPOSABLE) ×1 IMPLANT
STEM POLY PAT PLY 38M KNEE (Knees) IMPLANT
STEM TIBIA 5 DEG SZ E L KNEE (Knees) IMPLANT
SUT MNCRL AB 4-0 PS2 18 (SUTURE) ×1 IMPLANT
SUT VIC AB 2-0 CT1 TAPERPNT 27 (SUTURE) ×3 IMPLANT
SUTURE STRATFX 0 PDS 27 VIOLET (SUTURE) ×1 IMPLANT
TOWEL GREEN STERILE FF (TOWEL DISPOSABLE) ×1 IMPLANT
TRAY CATH INTERMITTENT SS 16FR (CATHETERS) IMPLANT
TRAY FOLEY MTR SLVR 16FR STAT (SET/KITS/TRAYS/PACK) ×1 IMPLANT
TUBE SUCTION HIGH CAP CLEAR NV (SUCTIONS) ×1 IMPLANT
WRAP KNEE MAXI GEL POST OP (GAUZE/BANDAGES/DRESSINGS) ×1 IMPLANT

## 2023-12-07 NOTE — Anesthesia Procedure Notes (Signed)
 Anesthesia Regional Block: Adductor canal block   Pre-Anesthetic Checklist: , timeout performed,  Correct Patient, Correct Site, Correct Laterality,  Correct Procedure, Correct Position, site marked,  Risks and benefits discussed,  Surgical consent,  Pre-op evaluation,  At surgeon's request and post-op pain management  Laterality: Left  Prep: chloraprep       Needles:  Injection technique: Single-shot  Needle Type: Echogenic Stimulator Needle     Needle Length: 10cm  Needle Gauge: 21   Needle insertion depth: 7 cm   Additional Needles:   Procedures:,,,, ultrasound used (permanent image in chart),,    Narrative:  Start time: 12/07/2023 6:54 AM End time: 12/07/2023 6:59 AM Injection made incrementally with aspirations every 5 mL.  Performed by: Personally  Anesthesiologist: Jerrye Sharper, MD  Additional Notes: Timeout performed. Patient sedated. Relevant anatomy ID'd using US . Incremental 2-5ml injection of LA with frequent aspiration. Patient tolerated procedure well.

## 2023-12-07 NOTE — Anesthesia Procedure Notes (Signed)
 Procedure Name: MAC Date/Time: 12/07/2023 7:20 AM  Performed by: Nada Corean CROME, CRNAPre-anesthesia Checklist: Patient identified, Emergency Drugs available, Suction available, Patient being monitored and Timeout performed Patient Re-evaluated:Patient Re-evaluated prior to induction Oxygen Delivery Method: Simple face mask Preoxygenation: Pre-oxygenation with 100% oxygen Induction Type: IV induction Placement Confirmation: positive ETCO2 Dental Injury: Teeth and Oropharynx as per pre-operative assessment

## 2023-12-07 NOTE — Interval H&P Note (Signed)
 History and Physical Interval Note:  12/07/2023 6:26 AM  Rebecca Dougherty  has presented today for surgery, with the diagnosis of Left knee osteoarthritis.  The various methods of treatment have been discussed with the patient and family. After consideration of risks, benefits and other options for treatment, the patient has consented to  Procedure(s): ARTHROPLASTY, KNEE, TOTAL (Left) as a surgical intervention.  The patient's history has been reviewed, patient examined, no change in status, stable for surgery.  I have reviewed the patient's chart and labs.  Questions were answered to the patient's satisfaction.     Dempsey Ralston Venus

## 2023-12-07 NOTE — Discharge Instructions (Addendum)
 Dempsey Moan, MD Total Joint Specialist [facility] [address], [address] [place], [place] [address] [phone]  TOTAL KNEE REPLACEMENT POSTOPERATIVE DIRECTIONS    Knee Rehabilitation, Guidelines Following Surgery  Results after knee surgery are often greatly improved when you follow the exercise, range of motion and muscle strengthening exercises prescribed by your doctor. Safety measures are also important to protect the knee from further injury. If any of these exercises cause you to have increased pain or swelling in your knee joint, decrease the amount until you are comfortable again and slowly increase them. If you have problems or questions, call your caregiver or physical therapist for advice.   BLOOD CLOT PREVENTION Take 81 mg Aspirin  two times a day for three weeks following surgery. Then resume taking 81mg  aspirin  once daily. You may resume your vitamins/supplements upon discharge from the hospital. Do not take any NSAIDs (Advil , Aleve , Ibuprofen , Meloxicam , etc.) for 3 weeks, while taking 81mg  Aspirin  twice a day.  HOME CARE INSTRUCTIONS  Remove items at home which could result in a fall. This includes throw rugs or furniture in walking pathways.  ICE to the affected knee as much as tolerated. Icing helps control swelling. If the swelling is well controlled you will be more comfortable and rehab easier. Continue to use ice on the knee for pain and swelling from surgery. You may notice swelling that will progress down to the foot and ankle. This is normal after surgery. Elevate the leg when you are not up walking on it.    Continue to use the breathing machine which will help keep your temperature down. It is common for your temperature to cycle up and down following surgery, especially at night when you are not up moving around and exerting yourself. The breathing machine keeps your lungs expanded and your temperature down. Do not place pillow under the  operative knee, focus on keeping the knee straight while resting  DIET You may resume your previous home diet once you are discharged from the hospital.  DRESSING / WOUND CARE / SHOWERING Keep your bulky bandage on for 2 days. On the third post-operative day you may remove the dressing. Keep your incision dry, and apply a new clean dressing of gauze secured by paper tape. Perform dry dressing changes daily. You may begin showering 5 days following surgery. Do not soak or submerge your incision under water. Do not apply any ointments, lotions, or creams.   ACTIVITY For the first 5 days, the key is rest and control of pain and swelling Do your home exercises twice a day starting on post-operative day 3. On the days you go to physical therapy, just do the home exercises once that day. You should rest, ice and elevate the leg for 50 minutes out of every hour. Get up and walk/stretch for 10 minutes per hour. After 5 days you can increase your activity slowly as tolerated. Walk with your walker as instructed. Use the walker until you are comfortable transitioning to a cane. Walk with the cane in the opposite hand of the operative leg. You may discontinue the cane once you are comfortable and walking steadily. Avoid periods of inactivity such as sitting longer than an hour when not asleep. This helps prevent blood clots.  You may discontinue the knee immobilizer once you are able to perform a straight leg raise while lying down. You may resume a sexual relationship in one month or when given the OK by your doctor.  You  may return to work once you are cleared by your doctor.  Do not drive a car for 6 weeks or until released by your surgeon.  Do not drive while taking narcotics.  TED HOSE STOCKINGS Wear the elastic stockings on both legs for three weeks following surgery during the day. You may remove them at night for sleeping.  WEIGHT BEARING Weight bearing as tolerated with assist device (walker,  cane, etc) as directed, use it as long as suggested by your surgeon or therapist, typically at least 4-6 weeks.  POSTOPERATIVE CONSTIPATION PROTOCOL Constipation - defined medically as fewer than three stools per week and severe constipation as less than one stool per week.  One of the most common issues patients have following surgery is constipation.  Even if you have a regular bowel pattern at home, your normal regimen is likely to be disrupted due to multiple reasons following surgery.  Combination of anesthesia, postoperative narcotics, change in appetite and fluid intake all can affect your bowels.  In order to avoid complications following surgery, here are some recommendations in order to help you during your recovery period.  Colace (docusate) - Pick up an over-the-counter form of Colace or another stool softener and take twice a day as long as you are requiring postoperative pain medications.  Take with a full glass of water daily.  If you experience loose stools or diarrhea, hold the colace until you stool forms back up. If your symptoms do not get better within 1 week or if they get worse, check with your doctor. Dulcolax (bisacodyl) - Pick up over-the-counter and take as directed by the product packaging as needed to assist with the movement of your bowels.  Take with a full glass of water.  Use this product as needed if not relieved by Colace only.  MiraLax (polyethylene glycol) - Pick up over-the-counter to have on hand. MiraLax is a solution that will increase the amount of water in your bowels to assist with bowel movements.  Take as directed and can mix with a glass of water, juice, soda, coffee, or tea. Take if you go more than two days without a movement. Do not use MiraLax more than once per day. Call your doctor if you are still constipated or irregular after using this medication for 7 days in a row.  If you continue to have problems with postoperative constipation, please contact  the office for further assistance and recommendations.  If you experience the worst abdominal pain ever or develop nausea or vomiting, please contact the office immediatly for further recommendations for treatment.  ITCHING If you experience itching with your medications, try taking only a single pain pill, or even half a pain pill at a time.  You can also use Benadryl over the counter for itching or also to help with sleep.   MEDICATIONS See your medication summary on the "After Visit Summary" that the nursing staff will review with you prior to discharge.  You may have some home medications which will be placed on hold until you complete the course of blood thinner medication.  It is important for you to complete the blood thinner medication as prescribed by your surgeon.  Continue your approved medications as instructed at time of discharge.  PRECAUTIONS If you experience chest pain or shortness of breath - call 911 immediately for transfer to the hospital emergency department.  If you develop a fever greater that 101 F, purulent drainage from wound, increased redness or drainage from wound,  foul odor from the wound/dressing, or calf pain - CONTACT YOUR SURGEON.                                                   FOLLOW-UP APPOINTMENTS Make sure you keep all of your appointments after your operation with your surgeon and caregivers. You should call the office at the above phone number and make an appointment for approximately two weeks after the date of your surgery or on the date instructed by your surgeon outlined in the After Visit Summary.  RANGE OF MOTION AND STRENGTHENING EXERCISES  Rehabilitation of the knee is important following a knee injury or an operation. After just a few days of immobilization, the muscles of the thigh which control the knee become weakened and shrink (atrophy). Knee exercises are designed to build up the tone and strength of the thigh muscles and to improve knee  motion. Often times heat used for twenty to thirty minutes before working out will loosen up your tissues and help with improving the range of motion but do not use heat for the first two weeks following surgery. These exercises can be done on a training (exercise) mat, on the floor, on a table or on a bed. Use what ever works the best and is most comfortable for you Knee exercises include:  Leg Lifts - While your knee is still immobilized in a splint or cast, you can do straight leg raises. Lift the leg to 60 degrees, hold for 3 sec, and slowly lower the leg. Repeat 10-20 times 2-3 times daily. Perform this exercise against resistance later as your knee gets better.  Quad and Hamstring Sets - Tighten up the muscle on the front of the thigh (Quad) and hold for 5-10 sec. Repeat this 10-20 times hourly. Hamstring sets are done by pushing the foot backward against an object and holding for 5-10 sec. Repeat as with quad sets.  Leg Slides: Lying on your back, slowly slide your foot toward your buttocks, bending your knee up off the floor (only go as far as is comfortable). Then slowly slide your foot back down until your leg is flat on the floor again. Angel Wings: Lying on your back spread your legs to the side as far apart as you can without causing discomfort.  A rehabilitation program following serious knee injuries can speed recovery and prevent re-injury in the future due to weakened muscles. Contact your doctor or a physical therapist for more information on knee rehabilitation.   POST-OPERATIVE OPIOID TAPER INSTRUCTIONS: It is important to wean off of your opioid medication as soon as possible. If you do not need pain medication after your surgery it is ok to stop day one. Opioids include: Codeine, Hydrocodone (Norco, Vicodin), Oxycodone (Percocet, oxycontin ) and hydromorphone  amongst others.  Long term and even short term use of opiods can cause: Increased pain  response Dependence Constipation Depression Respiratory depression And more.  Withdrawal symptoms can include Flu like symptoms Nausea, vomiting And more Techniques to manage these symptoms Hydrate well Eat regular healthy meals Stay active Use relaxation techniques(deep breathing, meditating, yoga) Do Not substitute Alcohol to help with tapering If you have been on opioids for less than two weeks and do not have pain than it is ok to stop all together.  Plan to wean off of opioids This plan should start within  one week post op of your joint replacement. Maintain the same interval or time between taking each dose and first decrease the dose.  Cut the total daily intake of opioids by one tablet each day Next start to increase the time between doses. The last dose that should be eliminated is the evening dose.   IF YOU ARE TRANSFERRED TO A SKILLED REHAB FACILITY If the patient is transferred to a skilled rehab facility following release from the hospital, a list of the current medications will be sent to the facility for the patient to continue.  When discharged from the skilled rehab facility, please have the facility set up the patient's Home Health Physical Therapy prior to being released. Also, the skilled facility will be responsible for providing the patient with their medications at time of release from the facility to include their pain medication, the muscle relaxants, and their blood thinner medication. If the patient is still at the rehab facility at time of the two week follow up appointment, the skilled rehab facility will also need to assist the patient in arranging follow up appointment in our office and any transportation needs.  MAKE SURE YOU:  Understand these instructions.  Get help right away if you are not doing well or get worse.   DENTAL ANTIBIOTICS:  In most cases prophylactic antibiotics for Dental procdeures after total joint surgery are not  necessary.  Exceptions are as follows:  1. History of prior total joint infection  2. Severely immunocompromised (Organ Transplant, cancer chemotherapy, Rheumatoid biologic meds such as Humera)  3. Poorly controlled diabetes (A1C &gt; 8.0, blood glucose over 200)  If you have one of these conditions, contact your surgeon for an antibiotic prescription, prior to your dental procedure.    Pick up stool softner and laxative for home use following surgery while on pain medications. Do not submerge incision under water. Please use good hand washing techniques while changing dressing each day. May shower starting three days after surgery. Please use a clean towel to pat the incision dry following showers. Continue to use ice for pain and swelling after surgery. Do not use any lotions or creams on the incision until instructed by your surgeon.

## 2023-12-07 NOTE — Anesthesia Postprocedure Evaluation (Signed)
 Anesthesia Post Note  Patient: Rebecca Dougherty  Procedure(s) Performed: ARTHROPLASTY, KNEE, TOTAL (Left: Knee)     Patient location during evaluation: PACU Anesthesia Type: Spinal Level of consciousness: oriented and awake and alert Pain management: pain level controlled Vital Signs Assessment: post-procedure vital signs reviewed and stable Respiratory status: spontaneous breathing, respiratory function stable and nonlabored ventilation Cardiovascular status: blood pressure returned to baseline and stable Postop Assessment: no headache, no backache and no apparent nausea or vomiting Anesthetic complications: no   No notable events documented.  Last Vitals:  Vitals:   12/07/23 1015 12/07/23 1030  BP: 107/75 104/60  Pulse: 71 72  Resp: 17 15  Temp:    SpO2: 94% 94%    Last Pain:  Vitals:   12/07/23 1030  TempSrc:   PainSc: 0-No pain                 Ebelyn Bohnet A.

## 2023-12-07 NOTE — Progress Notes (Signed)
 Physical Therapy Treatment Patient Details Name: Rebecca Dougherty MRN: [id number] DOB: March 04, 1970 Today's Date: 12/07/2023   History of Present Illness 53 yo female presents to therapy s/p L TKA on 12/07/2023 due to failure of conservative measures. Pt PMH includes but is not limited to: R ankle fx s/p ORIF,  arthritis, anxiety, asthma, HA, dermatitis venenata, depression, hypotension, TIA, and several knee surgeries including R TKA on 08/10/2023.    PT Comments   Rebecca Dougherty is a 53 y.o. female POD 0 s/p L TKA. Patient reports IND with mobility at baseline. Patient is now limited by functional impairments (see PT problem list below) and requires CGA for transfers and gait with RW. Patient was able to ambulate 50 feet with RW and CGA and cues for safe walker management. Patient educated on safe sequencing for stair mobility with L handrail and SPC, fall risk prevention, use of CP/ice, pain management and goal and car transfers pt and spouse verbalized understanding of safe guarding position for people assisting with mobility. Patient instructed in exercises to facilitate ROM and circulation reviewed and HO provided. Patient will benefit from continued skilled PT interventions to address impairments and progress towards PLOF. Patient has met mobility goals at adequate level for discharge home with family support and OPPT services scheduled for 11/20; will continue to follow if pt continues acute stay to progress towards Mod I goals.    If plan is discharge home, recommend the following: A little help with walking and/or transfers;A little help with bathing/dressing/bathroom;Assistance with cooking/housework;Help with stairs or ramp for entrance;Assist for transportation   Can travel by private vehicle        Equipment Recommendations  None recommended by PT    Recommendations for Other Services       Precautions / Restrictions Precautions Precautions: Knee;Fall Restrictions Weight  Bearing Restrictions Per Provider Order: No     Mobility  Bed Mobility Overal bed mobility: Needs Assistance Bed Mobility: Supine to Sit     Supine to sit: Supervision     General bed mobility comments: min cues and HOB slightly elevated    Transfers Overall transfer level: Needs assistance Equipment used: Rolling walker (2 wheels) Transfers: Sit to/from Stand Sit to Stand: Contact guard assist           General transfer comment: min cues    Ambulation/Gait Ambulation/Gait assistance: Contact guard assist Gait Distance (Feet): 50 Feet Assistive device: Rolling walker (2 wheels) Gait Pattern/deviations: Step-to pattern, Decreased stance time - left, Antalgic, Trunk flexed Gait velocity: decreased     General Gait Details: min cues for RW management   Stairs Stairs: Yes Stairs assistance: Contact guard assist Stair Management: Two rails, One rail Left, With cane Number of Stairs: 3 General stair comments: min cues for safety sequencing and step to pattern with step navigation initally with B handrails and pt able to progress to L handrail and use of SPC to emulate home setting   Wheelchair Mobility     Tilt Bed    Modified Rankin (Stroke Patients Only)       Balance Overall balance assessment: Needs assistance Sitting-balance support: Feet supported Sitting balance-Leahy Scale: Good     Standing balance support: Bilateral upper extremity supported, During functional activity, Reliant on assistive device for balance Standing balance-Leahy Scale: Poor                              Communication Communication Communication: No apparent difficulties  Cognition Arousal: Alert Behavior During Therapy: WFL for tasks assessed/performed   PT - Cognitive impairments: No apparent impairments                         Following commands: Intact      Cueing    Exercises Total Joint Exercises Ankle Circles/Pumps: AROM, Both, 15  reps Quad Sets: AROM, Left, 5 reps Short Arc Quad: AROM, Left, 5 reps Heel Slides: AROM, Left, 5 reps Hip ABduction/ADduction: AROM, Left, 5 reps Straight Leg Raises: AROM, Left, 5 reps Knee Flexion: AROM, 5 reps, Left, Seated    General Comments        Pertinent Vitals/Pain Pain Assessment Pain Assessment: 0-10 Pain Score: 7  Pain Location: L knee and LE Pain Descriptors / Indicators: Aching, Contraction, Discomfort, Dull, Grimacing, Operative site guarding Pain Intervention(s): Limited activity within patient's tolerance, Monitored during session, Premedicated before session, Repositioned, Ice applied    Home Living Family/patient expects to be discharged to:: Private residence Living Arrangements: Spouse/significant other Available Help at Discharge: Family Type of Home: House Home Access: Stairs to enter Entrance Stairs-Rails: Right Entrance Stairs-Number of Steps: 14 Alternate Level Stairs-Number of Steps: flight with landing Home Layout: Laundry or work area in basement;Multi-level Home Equipment: Agricultural Consultant (2 wheels);Cane - single point;Rollator (4 wheels) Additional Comments: car cane    Prior Function            PT Goals (current goals can now be found in the care plan section) Acute Rehab PT Goals Patient Stated Goal: get back to moving, walk in the back yard and use the rebounder. PT Goal Formulation: With patient Time For Goal Achievement: 12/21/23 Potential to Achieve Goals: Good Progress towards PT goals: Progressing toward goals    Frequency    7X/week      PT Plan      Co-evaluation              AM-PAC PT 6 Clicks Mobility   Outcome Measure  Help needed turning from your back to your side while in a flat bed without using bedrails?: None Help needed moving from lying on your back to sitting on the side of a flat bed without using bedrails?: A Little Help needed moving to and from a bed to a chair (including a wheelchair)?: A  Little Help needed standing up from a chair using your arms (e.g., wheelchair or bedside chair)?: A Little Help needed to walk in hospital room?: A Little Help needed climbing 3-5 steps with a railing? : A Little 6 Click Score: 19    End of Session Equipment Utilized During Treatment: Gait belt Activity Tolerance: Patient tolerated treatment well Patient left: with family/visitor present;in chair;with call bell/phone within reach Nurse Communication: Patient requests pain meds PT Visit Diagnosis: Unsteadiness on feet (R26.81);Other abnormalities of gait and mobility (R26.89);Muscle weakness (generalized) (M62.81);Pain;Difficulty in walking, not elsewhere classified (R26.2) Pain - Right/Left: Left Pain - part of body: Knee;Leg     Time: 8572-8548 PT Time Calculation (min) (ACUTE ONLY): 24 min  Charges:    $Gait Training: 8-22 mins $Therapeutic Exercise: 8-22 mins PT General Charges $$ ACUTE PT VISIT: 1 Visit                     [place], PT Acute Rehab    [place] VEAR Drone 12/07/2023, 3:31 PM

## 2023-12-07 NOTE — Anesthesia Procedure Notes (Addendum)
 Spinal  Patient location during procedure: OR Start time: 12/07/2023 7:21 AM End time: 12/07/2023 7:24 AM Reason for block: surgical anesthesia Staffing Performed: anesthesiologist  Anesthesiologist: Jerrye Sharper, MD Performed by: Jerrye Sharper, MD Authorized by: Jerrye Sharper, MD   Preanesthetic Checklist Completed: patient identified, IV checked, site marked, risks and benefits discussed, surgical consent, monitors and equipment checked, pre-op evaluation and timeout performed Spinal Block Patient position: sitting Prep: DuraPrep and site prepped and draped Patient monitoring: heart rate, cardiac monitor, continuous pulse ox and blood pressure Approach: midline Location: L3-4 Injection technique: single-shot Needle Needle type: Pencan  Needle gauge: 24 G Needle length: 9 cm Needle insertion depth: 7 cm Assessment Sensory level: T6 Events: CSF return Additional Notes Patient tolerated procedure well. Adequate sensory level.

## 2023-12-07 NOTE — Evaluation (Signed)
 Physical Therapy Evaluation Patient Details Name: Rebecca Dougherty MRN: [id number] DOB: 1970-11-04 Today's Date: 12/07/2023  History of Present Illness  53 yo female presents to therapy s/p L TKA on 12/07/2023 due to failure of conservative measures. Pt PMH includes but is not limited to: R ankle fx s/p ORIF,  arthritis, anxiety, asthma, HA, dermatitis venenata, depression, hypotension, TIA, and several knee surgeries including R TKA on 08/10/2023.  Clinical Impression    Rebecca Dougherty is a 53 y.o. female POD 0 s/p L TKA. Patient reports IND with mobility at baseline. Patient is now limited by functional impairments (see PT problem list below) Patient instructed in exercise to facilitate ROM and circulation to manage edema. Pt requested to discontinue PT eval secondary to increased pain with L LE TE--SLR, ankle pumps and hip abd x 5 each. Pt had been administered oxycodone  5 mg with robaxin  at 1108 and an additional 5 mg of oxycodone  at 1200. Patient will benefit from continued skilled PT interventions to address impairments and progress towards PLOF. Acute PT will follow to progress mobility and stair training in preparation for safe discharge home. PT to return later in the day to re-assess pt pain response and ability to safely transition home same day.        If plan is discharge home, recommend the following: A little help with walking and/or transfers;A little help with bathing/dressing/bathroom;Assistance with cooking/housework;Help with stairs or ramp for entrance;Assist for transportation   Can travel by private vehicle        Equipment Recommendations None recommended by PT  Recommendations for Other Services       Functional Status Assessment Patient has had a recent decline in their functional status and demonstrates the ability to make significant improvements in function in a reasonable and predictable amount of time.     Precautions / Restrictions  Precautions Precautions: Knee;Fall Restrictions Weight Bearing Restrictions Per Provider Order: No      Mobility  Bed Mobility                    Transfers                        Ambulation/Gait                  Stairs            Wheelchair Mobility     Tilt Bed    Modified Rankin (Stroke Patients Only)       Balance                                             Pertinent Vitals/Pain Pain Assessment Pain Assessment: 0-10 Pain Score: 10-Worst pain ever (pt pain at rest 7/10 and increased to 10/10 with  LLE TE) Pain Location: L knee and LE Pain Descriptors / Indicators: Aching, Contraction, Discomfort, Dull, Grimacing, Operative site guarding Pain Intervention(s): Limited activity within patient's tolerance, Monitored during session, Premedicated before session, Repositioned, Ice applied    Home Living Family/patient expects to be discharged to:: Private residence Living Arrangements: Spouse/significant other Available Help at Discharge: Family Type of Home: House Home Access: Stairs to enter Entrance Stairs-Rails: Right Entrance Stairs-Number of Steps: 14 Alternate Level Stairs-Number of Steps: flight with landing Home Layout: Laundry or work area in basement;Multi-level Home Equipment: Agricultural Consultant (2 wheels);Cane - single  point;Rollator (4 wheels) Additional Comments: car cane    Prior Function Prior Level of Function : Independent/Modified Independent;Working/employed             Mobility Comments: IND no AD for all ADLs self care tasks and IADLs       Extremity/Trunk Assessment        Lower Extremity Assessment Lower Extremity Assessment: LLE deficits/detail LLE Deficits / Details: ankle DF/PF 4/5: SLR < 10 degree lag LLE Sensation: WNL    Cervical / Trunk Assessment Cervical / Trunk Assessment: Normal  Communication   Communication Communication: No apparent difficulties     Cognition Arousal: Alert Behavior During Therapy: WFL for tasks assessed/performed   PT - Cognitive impairments: No apparent impairments                         Following commands: Intact       Cueing       General Comments      Exercises Total Joint Exercises Ankle Circles/Pumps: AROM, Both, 15 reps Hip ABduction/ADduction: AROM, Left, 5 reps Straight Leg Raises: AROM, Left, 5 reps   Assessment/Plan    PT Assessment Patient needs continued PT services  PT Problem List Decreased strength;Decreased range of motion;Decreased activity tolerance;Decreased balance;Decreased mobility;Decreased coordination;Pain       PT Treatment Interventions DME instruction;Gait training;Stair training;Functional mobility training;Therapeutic activities;Therapeutic exercise;Balance training;Neuromuscular re-education;Patient/family education;Modalities    PT Goals (Current goals can be found in the Care Plan section)  Acute Rehab PT Goals Patient Stated Goal: get back to moving, walk in the back yard and use the rebounder. PT Goal Formulation: With patient Time For Goal Achievement: 12/21/23 Potential to Achieve Goals: Good    Frequency 7X/week     Co-evaluation               AM-PAC PT 6 Clicks Mobility  Outcome Measure Help needed turning from your back to your side while in a flat bed without using bedrails?: A Little Help needed moving from lying on your back to sitting on the side of a flat bed without using bedrails?: Total Help needed moving to and from a bed to a chair (including a wheelchair)?: Total Help needed standing up from a chair using your arms (e.g., wheelchair or bedside chair)?: Total Help needed to walk in hospital room?: Total Help needed climbing 3-5 steps with a railing? : Total 6 Click Score: 8    End of Session   Activity Tolerance: Patient limited by pain Patient left: in bed;with call bell/phone within reach;with family/visitor  present Nurse Communication: Patient requests pain meds PT Visit Diagnosis: Unsteadiness on feet (R26.81);Other abnormalities of gait and mobility (R26.89);Muscle weakness (generalized) (M62.81);Pain;Difficulty in walking, not elsewhere classified (R26.2) Pain - Right/Left: Left Pain - part of body: Knee;Leg    Time: 8784-8761 PT Time Calculation (min) (ACUTE ONLY): 23 min   Charges:   PT Evaluation $PT Eval Low Complexity: 1 Low PT Treatments $Therapeutic Exercise: 8-22 mins PT General Charges $$ ACUTE PT VISIT: 1 Visit         [place], PT Acute Rehab   [place] VEAR Drone 12/07/2023, 1:26 PM

## 2023-12-07 NOTE — Transfer of Care (Signed)
 Immediate Anesthesia Transfer of Care Note  Patient: Rebecca Dougherty  Procedure(s) Performed: ARTHROPLASTY, KNEE, TOTAL (Left: Knee)  Patient Location: PACU  Anesthesia Type:MAC, Regional, and Spinal  Level of Consciousness: awake, alert , oriented, and patient cooperative  Airway & Oxygen Therapy: Patient Spontanous Breathing and Patient connected to face mask oxygen  Post-op Assessment: Report given to RN and Post -op Vital signs reviewed and stable  Post vital signs: Reviewed and stable  Last Vitals:  Vitals Value Taken Time  BP 89/57   Temp    Pulse 76 12/07/23 09:05  Resp 14 12/07/23 09:05  SpO2 100 % 12/07/23 09:05  Vitals shown include unfiled device data.  Last Pain:  Vitals:   12/07/23 0615  TempSrc: Oral  PainSc:          Complications: No notable events documented.

## 2023-12-07 NOTE — Op Note (Signed)
 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 Billy Turvey, MD  Assistant- Corean Sender, PA-C   Anesthesia-  Adductor canal block and spinal  EBL- 25 ml   Drains None  Tourniquet time-  Total Tourniquet Time Documented: Thigh (Left) - 49 minutes Total: Thigh (Left) - 49 minutes     Complications- None  Condition-PACU - hemodynamically stable.   Brief Clinical Note  Rebecca Dougherty is a 53 y.o. year old female with end stage OA of her left knee with progressively worsening pain and dysfunction. She has constant pain, with activity and at rest and significant functional deficits with difficulties even with ADLs. She has had extensive non-op management including analgesics, injections of cortisone and viscosupplements, and home exercise program, but remains in significant pain with significant dysfunction. Radiographs show bone on bone arthritis medial and patellofemoral. She presents now for left Total Knee Arthroplasty.     Procedure in detail---   The patient is brought into the operating room and positioned supine on the operating table. After successful administration of  Adductor canal block and spinal,   a tourniquet is placed high on the  Left thigh(s) and the lower extremity is prepped and draped in the usual sterile fashion. Time out is performed by the operating team and then the  Left lower extremity is wrapped in Esmarch, knee flexed and the tourniquet inflated to 300 mmHg.       A midline incision is made with a ten blade through the subcutaneous tissue to the level of the extensor mechanism. A fresh blade is used to make a medial parapatellar arthrotomy. Soft tissue over the proximal medial tibia is subperiosteally elevated to the joint line  with a knife and into the semimembranosus bursa with a Cobb elevator. Soft tissue over the proximal lateral tibia is elevated with attention being paid to avoiding the patellar tendon on the tibial tubercle. The patella is everted, knee flexed 90 degrees and the ACL and PCL are removed. Findings are bone on bone medial and patellofemoral with large global osteophytes        The drill is used to create a starting hole in the distal femur and the canal is thoroughly irrigated with sterile saline to remove the fatty contents. The 5 degree Left  valgus alignment guide is placed into the femoral canal and the distal femoral cutting block is pinned to remove 10 mm off the distal femur. Resection is made with an oscillating saw.      The tibia is subluxed forward and the menisci are removed. The extramedullary alignment guide is placed referencing proximally at the medial aspect of the tibial tubercle and distally along the second metatarsal axis and tibial crest. The block is pinned to remove 2mm off the more deficient medial  side. Resection is made with an oscillating saw. Size E is the most appropriate size for the tibia and the proximal tibia is prepared with the modular drill and keel punch for that size.      The femoral sizing guide is placed and size 9 is most appropriate. Rotation is marked off the epicondylar axis and confirmed by creating a rectangular flexion gap at 90 degrees. The size 9 cutting block is pinned in this rotation and the anterior, posterior and chamfer cuts are made with the oscillating saw. The intercondylar  block is then placed and that cut is made.      Trial size E tibial component, trial size 9 posterior stabilized femur and a 12  mm posterior stabilized fixed bearing insert trial is placed. Full extension is achieved with excellent varus/valgus and anterior/posterior balance throughout full range of motion. The patella is everted and thickness measured to be 24  mm. Free hand  resection is taken to 14 mm, a 38 template is placed, lug holes are drilled, trial patella is placed, and it tracks normally. Osteophytes are removed off the posterior femur with the trial in place. All trials are removed and the cut bone surfaces prepared with pulsatile lavage. Cement is mixed and once ready for implantation, the size E tibial implant, size  9 posterior stabilized femoral component, and the size 38 patella are cemented in place and the patella is held with the clamp. The trial insert is placed and the knee held in full extension. The Exparel  (20 ml mixed with 60 ml saline) is injected into the extensor mechanism, posterior capsule, medial and lateral gutters and subcutaneous tissues.  All extruded cement is removed and once the cement is hard the permanent 12 mm posterior stabilized fixed bearing insert is placed into the tibial tray.      The wound is copiously irrigated with saline solution and the extensor mechanism closed with # 0 Stratofix suture. The tourniquet is released for a total tourniquet time of 49  minutes. Flexion against gravity is 140 degrees and the patella tracks normally. Subcutaneous tissue is closed with 2.0 vicryl and subcuticular with running 4.0 Monocryl. The incision is cleaned and dried and steri-strips and a bulky sterile dressing are applied. The limb is placed into a knee immobilizer and the patient is awakened and transported to recovery in stable condition.      Please note that a surgical assistant was a medical necessity for this procedure in order to perform it in a safe and expeditious manner. Surgical assistant was necessary to retract the ligaments and vital neurovascular structures to prevent injury to them and also necessary for proper positioning of the limb to allow for anatomic placement of the prosthesis.   Dempsey ROCKFORD Pleshette Tomasini, MD    12/07/2023, 8:39 AM

## 2023-12-08 ENCOUNTER — Encounter (HOSPITAL_COMMUNITY): Payer: Self-pay | Admitting: Orthopedic Surgery

## 2024-02-15 ENCOUNTER — Other Ambulatory Visit
